# Patient Record
Sex: Female | Born: 1970 | Race: White | Hispanic: No | Marital: Married | Smoking: Current every day smoker
Health system: Southern US, Community
[De-identification: ages and names within clinical notes are randomized; demographics above are authoritative.]

## PROBLEM LIST (undated history)

## (undated) DIAGNOSIS — R630 Anorexia: Secondary | ICD-10-CM

## (undated) DIAGNOSIS — F329 Major depressive disorder, single episode, unspecified: Secondary | ICD-10-CM

## (undated) DIAGNOSIS — F1010 Alcohol abuse, uncomplicated: Secondary | ICD-10-CM

## (undated) DIAGNOSIS — F1920 Other psychoactive substance dependence, uncomplicated: Secondary | ICD-10-CM

## (undated) DIAGNOSIS — F319 Bipolar disorder, unspecified: Secondary | ICD-10-CM

## (undated) DIAGNOSIS — F419 Anxiety disorder, unspecified: Secondary | ICD-10-CM

## (undated) DIAGNOSIS — F32A Depression, unspecified: Secondary | ICD-10-CM

## (undated) DIAGNOSIS — M549 Dorsalgia, unspecified: Secondary | ICD-10-CM

## (undated) DIAGNOSIS — K589 Irritable bowel syndrome without diarrhea: Secondary | ICD-10-CM

---

## 2010-02-23 ENCOUNTER — Ambulatory Visit (HOSPITAL_COMMUNITY): Admission: RE | Admit: 2010-02-23 | Discharge: 2010-02-23 | Payer: Self-pay | Admitting: Obstetrics

## 2010-03-23 ENCOUNTER — Ambulatory Visit (HOSPITAL_COMMUNITY): Admission: RE | Admit: 2010-03-23 | Discharge: 2010-03-23 | Payer: Self-pay | Admitting: Obstetrics

## 2010-04-13 ENCOUNTER — Ambulatory Visit (HOSPITAL_COMMUNITY): Admission: RE | Admit: 2010-04-13 | Discharge: 2010-04-13 | Payer: Self-pay | Admitting: Obstetrics

## 2010-05-24 ENCOUNTER — Ambulatory Visit (HOSPITAL_COMMUNITY): Admission: RE | Admit: 2010-05-24 | Discharge: 2010-05-24 | Payer: Self-pay | Admitting: Obstetrics

## 2010-07-26 ENCOUNTER — Inpatient Hospital Stay (HOSPITAL_COMMUNITY): Admission: AD | Admit: 2010-07-26 | Discharge: 2010-07-27 | Payer: Self-pay | Admitting: Family Medicine

## 2010-08-07 ENCOUNTER — Inpatient Hospital Stay (HOSPITAL_COMMUNITY): Admission: AD | Admit: 2010-08-07 | Discharge: 2010-08-08 | Payer: Self-pay | Admitting: Obstetrics and Gynecology

## 2010-09-15 ENCOUNTER — Inpatient Hospital Stay (HOSPITAL_COMMUNITY): Admission: RE | Admit: 2010-09-15 | Discharge: 2010-09-18 | Payer: Self-pay | Admitting: Obstetrics & Gynecology

## 2010-10-17 ENCOUNTER — Emergency Department (HOSPITAL_COMMUNITY): Admission: EM | Admit: 2010-10-17 | Discharge: 2010-10-17 | Payer: Self-pay | Admitting: Emergency Medicine

## 2010-10-31 ENCOUNTER — Encounter
Admission: RE | Admit: 2010-10-31 | Discharge: 2010-12-01 | Payer: Self-pay | Source: Home / Self Care | Attending: Obstetrics and Gynecology | Admitting: Obstetrics and Gynecology

## 2010-12-15 ENCOUNTER — Emergency Department (HOSPITAL_COMMUNITY)
Admission: EM | Admit: 2010-12-15 | Discharge: 2010-12-15 | Payer: Self-pay | Source: Home / Self Care | Admitting: Emergency Medicine

## 2010-12-25 ENCOUNTER — Encounter: Payer: Self-pay | Admitting: Obstetrics

## 2010-12-29 ENCOUNTER — Emergency Department (HOSPITAL_COMMUNITY)
Admission: EM | Admit: 2010-12-29 | Discharge: 2010-12-29 | Payer: Self-pay | Source: Home / Self Care | Admitting: Emergency Medicine

## 2011-01-30 ENCOUNTER — Ambulatory Visit: Payer: Medicaid Other | Admitting: Rehabilitative and Restorative Service Providers"

## 2011-02-14 LAB — URINALYSIS, ROUTINE W REFLEX MICROSCOPIC
Bilirubin Urine: NEGATIVE
Ketones, ur: NEGATIVE mg/dL
Protein, ur: NEGATIVE mg/dL
Specific Gravity, Urine: 1.006 (ref 1.005–1.030)
Urobilinogen, UA: 0.2 mg/dL (ref 0.0–1.0)

## 2011-02-14 LAB — POCT I-STAT, CHEM 8
BUN: 8 mg/dL (ref 6–23)
Creatinine, Ser: 0.8 mg/dL (ref 0.4–1.2)
Glucose, Bld: 93 mg/dL (ref 70–99)
HCT: 44 % (ref 36.0–46.0)
Hemoglobin: 15 g/dL (ref 12.0–15.0)
Potassium: 3.8 mEq/L (ref 3.5–5.1)
Sodium: 140 mEq/L (ref 135–145)
TCO2: 29 mmol/L (ref 0–100)

## 2011-02-14 LAB — LITHIUM LEVEL: Lithium Lvl: 1.11 mEq/L (ref 0.80–1.40)

## 2011-02-14 LAB — CBC
MCH: 27.5 pg (ref 26.0–34.0)
MCHC: 32 g/dL (ref 30.0–36.0)
Platelets: 267 10*3/uL (ref 150–400)
RBC: 4.83 MIL/uL (ref 3.87–5.11)
RDW: 12.8 % (ref 11.5–15.5)
WBC: 9.8 10*3/uL (ref 4.0–10.5)

## 2011-02-14 LAB — DIFFERENTIAL
Basophils Absolute: 0 10*3/uL (ref 0.0–0.1)
Basophils Relative: 0 % (ref 0–1)

## 2011-02-15 LAB — CBC
HCT: 27.8 % — ABNORMAL LOW (ref 36.0–46.0)
Hemoglobin: 9.4 g/dL — ABNORMAL LOW (ref 12.0–15.0)
MCH: 29.6 pg (ref 26.0–34.0)
MCV: 87.7 fL (ref 78.0–100.0)
RBC: 3.17 MIL/uL — ABNORMAL LOW (ref 3.87–5.11)
RDW: 14.7 % (ref 11.5–15.5)
WBC: 25.9 10*3/uL — ABNORMAL HIGH (ref 4.0–10.5)

## 2011-02-16 LAB — URINALYSIS, ROUTINE W REFLEX MICROSCOPIC
Ketones, ur: 15 mg/dL — AB
Protein, ur: NEGATIVE mg/dL

## 2011-02-16 LAB — CBC
MCH: 29.1 pg (ref 26.0–34.0)
MCV: 86.5 fL (ref 78.0–100.0)
Platelets: 184 10*3/uL (ref 150–400)

## 2011-02-16 LAB — RPR: RPR Ser Ql: NONREACTIVE

## 2011-02-17 LAB — URINALYSIS, ROUTINE W REFLEX MICROSCOPIC
Glucose, UA: 100 mg/dL — AB
Hgb urine dipstick: NEGATIVE
Nitrite: NEGATIVE
Protein, ur: NEGATIVE mg/dL
Urobilinogen, UA: 0.2 mg/dL (ref 0.0–1.0)
pH: 6.5 (ref 5.0–8.0)

## 2011-02-17 LAB — WET PREP, GENITAL

## 2011-02-17 LAB — STREP B DNA PROBE: Strep Group B Ag: NEGATIVE

## 2011-02-17 LAB — GC/CHLAMYDIA PROBE AMP, GENITAL: GC Probe Amp, Genital: NEGATIVE

## 2011-04-07 ENCOUNTER — Ambulatory Visit: Payer: Medicaid Other | Attending: Internal Medicine | Admitting: Physical Therapy

## 2011-04-07 DIAGNOSIS — M545 Low back pain, unspecified: Secondary | ICD-10-CM | POA: Insufficient documentation

## 2011-04-07 DIAGNOSIS — M546 Pain in thoracic spine: Secondary | ICD-10-CM | POA: Insufficient documentation

## 2011-04-07 DIAGNOSIS — R293 Abnormal posture: Secondary | ICD-10-CM | POA: Insufficient documentation

## 2011-04-07 DIAGNOSIS — IMO0001 Reserved for inherently not codable concepts without codable children: Secondary | ICD-10-CM | POA: Insufficient documentation

## 2011-04-17 ENCOUNTER — Encounter: Payer: Medicaid Other | Admitting: Physical Therapy

## 2011-07-18 ENCOUNTER — Other Ambulatory Visit: Payer: Self-pay | Admitting: Gastroenterology

## 2011-07-20 ENCOUNTER — Other Ambulatory Visit: Payer: Medicaid Other

## 2011-07-28 ENCOUNTER — Ambulatory Visit
Admission: RE | Admit: 2011-07-28 | Discharge: 2011-07-28 | Disposition: A | Discharge status: Home / Self Care | Payer: Medicaid Other | Source: Ambulatory Visit | Attending: Gastroenterology | Admitting: Gastroenterology

## 2011-08-04 ENCOUNTER — Ambulatory Visit (HOSPITAL_BASED_OUTPATIENT_CLINIC_OR_DEPARTMENT_OTHER)
Admission: RE | Admit: 2011-08-04 | Discharge: 2011-08-04 | Disposition: A | Discharge status: Home / Self Care | Payer: Medicaid Other | Source: Ambulatory Visit | Attending: Urology | Admitting: Urology

## 2011-08-04 DIAGNOSIS — Z01812 Encounter for preprocedural laboratory examination: Secondary | ICD-10-CM | POA: Insufficient documentation

## 2011-08-04 DIAGNOSIS — N35919 Unspecified urethral stricture, male, unspecified site: Secondary | ICD-10-CM | POA: Insufficient documentation

## 2011-08-11 NOTE — Op Note (Signed)
  [name], Barbara Shaw           ACCOUNT NO.:  [id number]  MEDICAL RECORD NO.:  [id number]  LOCATION:                                 FACILITY:  PHYSICIAN:  Danae Chen, M.D.  DATE OF BIRTH:  07-15-1971  DATE OF PROCEDURE:  08/04/2011 DATE OF DISCHARGE:                              OPERATIVE REPORT   PREOPERATIVE DIAGNOSIS:  Meatal stenosis.  POSTOPERATIVE DIAGNOSIS:  Meatal stenosis.  PROCEDURE DONE:  Cystoscopy and meatal dilation.  SURGEON:  Danae Chen, M.D.  ANESTHESIA:  General.  INDICATIONS:  The patient is a 40 year old female who has been complaining of frequency, hesitancy, straining on urination for about 8 to 9 months.  Her symptoms are getting progressively worse.  She is scheduled today for cystoscopy and meatal dilation.  The patient was identified by her wristband, and proper time-out was taken.  Under general anesthesia, she was prepped and draped and placed in the dorsal lithotomy position.  A panendoscope was inserted in the bladder. The bladder mucosa is normal.  There is no stone or tumor in the bladder.  The ureteral orifices are in normal position and shape, with clear efflux.  There is no evidence of submucosal hemorrhage.  The bladder was examined with the foroblique and the right-angle lenses.  There was no evidence of trabeculation.  The cystoscope was then removed.  The urethra was then gradually dilated up to #32 [place].  Bimanual pelvic examination was then done.  There is no evidence of pelvic mass, and the cervix is firm, and there is no adnexal mass.  The patient tolerated the procedure well and left the OR in satisfactory condition to postanesthesia care unit.     Danae Chen, M.D.     MN/MEDQ  D:  08/04/2011  T:  08/04/2011  Job:  [id number]  Electronically Signed by Lindaann Slough M.D. on 08/11/2011 06:32:17 PM

## 2011-11-08 ENCOUNTER — Emergency Department (HOSPITAL_COMMUNITY)
Admission: EM | Admit: 2011-11-08 | Discharge: 2011-11-09 | Disposition: A | Discharge status: Home / Self Care | Payer: Medicaid Other | Attending: Emergency Medicine | Admitting: Emergency Medicine

## 2011-11-08 ENCOUNTER — Encounter: Payer: Self-pay | Admitting: Emergency Medicine

## 2011-11-08 ENCOUNTER — Emergency Department (HOSPITAL_COMMUNITY): Payer: Medicaid Other

## 2011-11-08 DIAGNOSIS — R61 Generalized hyperhidrosis: Secondary | ICD-10-CM | POA: Insufficient documentation

## 2011-11-08 DIAGNOSIS — R197 Diarrhea, unspecified: Secondary | ICD-10-CM | POA: Insufficient documentation

## 2011-11-08 DIAGNOSIS — M549 Dorsalgia, unspecified: Secondary | ICD-10-CM | POA: Insufficient documentation

## 2011-11-08 DIAGNOSIS — R1031 Right lower quadrant pain: Secondary | ICD-10-CM | POA: Insufficient documentation

## 2011-11-08 DIAGNOSIS — R112 Nausea with vomiting, unspecified: Secondary | ICD-10-CM

## 2011-11-08 DIAGNOSIS — F411 Generalized anxiety disorder: Secondary | ICD-10-CM | POA: Insufficient documentation

## 2011-11-08 DIAGNOSIS — R509 Fever, unspecified: Secondary | ICD-10-CM | POA: Insufficient documentation

## 2011-11-08 DIAGNOSIS — R630 Anorexia: Secondary | ICD-10-CM | POA: Insufficient documentation

## 2011-11-08 DIAGNOSIS — Z79899 Other long term (current) drug therapy: Secondary | ICD-10-CM | POA: Insufficient documentation

## 2011-11-08 DIAGNOSIS — K589 Irritable bowel syndrome without diarrhea: Secondary | ICD-10-CM | POA: Insufficient documentation

## 2011-11-08 DIAGNOSIS — R000 Tachycardia, unspecified: Secondary | ICD-10-CM | POA: Insufficient documentation

## 2011-11-08 DIAGNOSIS — F3189 Other bipolar disorder: Secondary | ICD-10-CM | POA: Insufficient documentation

## 2011-11-08 HISTORY — DX: Anxiety disorder, unspecified: F41.9

## 2011-11-08 HISTORY — DX: Irritable bowel syndrome, unspecified: K58.9

## 2011-11-08 HISTORY — DX: Anorexia: R63.0

## 2011-11-08 HISTORY — DX: Bipolar disorder, unspecified: F31.9

## 2011-11-08 LAB — COMPREHENSIVE METABOLIC PANEL
ALT: 15 U/L (ref 0–35)
AST: 18 U/L (ref 0–37)
Alkaline Phosphatase: 52 U/L (ref 39–117)
CO2: 27 mEq/L (ref 19–32)
GFR calc Af Amer: >90 mL/min (ref >90)
GFR calc non Af Amer: >90 mL/min (ref >90)
Glucose, Bld: 89 mg/dL (ref 70–99)
Potassium: 3.5 mEq/L (ref 3.5–5.1)
Sodium: 140 mEq/L (ref 135–145)
Total Protein: 6.8 g/dL (ref 6.0–8.3)

## 2011-11-08 LAB — CBC
Platelets: 367 10*3/uL (ref 150–400)
RBC: 4.68 MIL/uL (ref 3.87–5.11)
RDW: 13.4 % (ref 11.5–15.5)
WBC: 16 10*3/uL — ABNORMAL HIGH (ref 4.0–10.5)

## 2011-11-08 LAB — URINALYSIS, ROUTINE W REFLEX MICROSCOPIC
Bilirubin Urine: NEGATIVE
Glucose, UA: NEGATIVE mg/dL
Hgb urine dipstick: NEGATIVE
Specific Gravity, Urine: 1.031 — ABNORMAL HIGH (ref 1.005–1.030)
Urobilinogen, UA: 0.2 mg/dL (ref 0.0–1.0)

## 2011-11-08 LAB — DIFFERENTIAL
Basophils Absolute: 0 10*3/uL (ref 0.0–0.1)
Lymphocytes Relative: 5 % — ABNORMAL LOW (ref 12–46)
Lymphs Abs: 0.8 10*3/uL (ref 0.7–4.0)
Neutrophils Relative %: 89 % — ABNORMAL HIGH (ref 43–77)

## 2011-11-08 LAB — URINE MICROSCOPIC-ADD ON

## 2011-11-08 MED ORDER — OXYCODONE-ACETAMINOPHEN 5-325 MG PO TABS: 2.0000 | ORAL_TABLET | ORAL | Status: AC | PRN

## 2011-11-08 MED ORDER — MORPHINE SULFATE 4 MG/ML IJ SOLN
INTRAMUSCULAR | Status: AC
  Administered 2011-11-08: 4 mg
  Filled 2011-11-08: qty 1

## 2011-11-08 MED ORDER — ONDANSETRON HCL 4 MG/2ML IJ SOLN
INTRAMUSCULAR | Status: AC
  Filled 2011-11-08: qty 2

## 2011-11-08 MED ORDER — LORAZEPAM 2 MG/ML IJ SOLN
1.0000 mg | Freq: Once | INTRAMUSCULAR | Status: AC
  Administered 2011-11-08: 1 mg via INTRAVENOUS
  Filled 2011-11-08: qty 1

## 2011-11-08 MED ORDER — MORPHINE SULFATE 4 MG/ML IJ SOLN
4.0000 mg | Freq: Once | INTRAMUSCULAR | Status: AC
  Administered 2011-11-08: 4 mg via INTRAVENOUS
  Filled 2011-11-08: qty 1

## 2011-11-08 MED ORDER — MORPHINE SULFATE 4 MG/ML IJ SOLN: 4.0000 mg | Freq: Once | INTRAMUSCULAR | Status: DC

## 2011-11-08 MED ORDER — ONDANSETRON HCL 4 MG/2ML IJ SOLN
4.0000 mg | Freq: Once | INTRAMUSCULAR | Status: AC
  Administered 2011-11-08: 4 mg via INTRAMUSCULAR
  Filled 2011-11-08: qty 2

## 2011-11-08 MED ORDER — IOHEXOL 300 MG/ML  SOLN
80.0000 mL | Freq: Once | INTRAMUSCULAR | Status: AC | PRN
  Administered 2011-11-08: 80 mL via INTRAVENOUS

## 2011-11-08 MED ORDER — ONDANSETRON HCL 4 MG PO TABS: 4.0000 mg | ORAL_TABLET | Freq: Four times a day (QID) | ORAL | Status: AC

## 2011-11-08 MED ORDER — SODIUM CHLORIDE 0.9 % IV BOLUS (SEPSIS)
1000.0000 mL | Freq: Once | INTRAVENOUS | Status: AC
  Administered 2011-11-08: 1000 mL via INTRAVENOUS

## 2011-11-08 NOTE — ED Notes (Signed)
Patient is resting comfortably. 

## 2011-11-08 NOTE — ED Notes (Signed)
Pt in restroom providing urine sample.

## 2011-11-08 NOTE — ED Notes (Signed)
Patient is resting comfortably. Reports feeling " a lot less anxious".

## 2011-11-08 NOTE — ED Notes (Signed)
Pt continues to worry about medications. Pt label placed on pt medications. Pt threatning to take home meds.

## 2011-11-08 NOTE — ED Notes (Signed)
Pt in hallway bed. Pt states she is claustrophobic and doesn't want to be here. Pt appears anxious.

## 2011-11-08 NOTE — ED Provider Notes (Signed)
History     CSN: [id number] Arrival date & time: 11/08/2011  6:34 PM   First MD Initiated Contact with Patient 11/08/11 1927      Chief Complaint  Patient presents with  . Nausea    since yesterday. pt also  c/o diarrhea.    (Consider location/radiation/quality/duration/timing/severity/associated sxs/prior treatment) HPI Comments: Patient states she's been having right lower quadrant pain as well as vomiting and diarrhea since last night.  She states the vomiting was first to occur.  She has had 7 emesis episodes today and 3 episodes of diarrhea.  She denies hematemesis, hematochezia, blood in her stool.  Patient reports having fevers, night sweats, and chills.  She denies lightheadedness, syncope, and headaches.  In addition patient is suffering from major anxiety.  She states that she had a panic attack on the way here and is unable to keep down her Ativan because of her emesis episodes.  She complains of nausea and stomach cramping.  Patient has no other complaints.   The history is provided by the patient.    Past Medical History  Diagnosis Date  . IBS (irritable bowel syndrome)   . Anxiety   . Anorexia   . Bipolar affective disorder     History reviewed. No pertinent past surgical history.  History reviewed. No pertinent family history.  History  Substance Use Topics  . Smoking status: Not on file  . Smokeless tobacco: Not on file  . Alcohol Use:     OB History    Grav Para Term Preterm Abortions TAB SAB Ect Mult Living                  Review of Systems  Constitutional: Positive for appetite change. Negative for fever and chills.  HENT: Negative for sore throat, mouth sores, trouble swallowing, neck stiffness and dental problem.   Eyes: Negative for visual disturbance.  Respiratory: Negative for cough, chest tightness, shortness of breath and wheezing.   Cardiovascular: Negative for chest pain.  Gastrointestinal: Positive for nausea, vomiting, abdominal pain  and diarrhea. Negative for constipation, blood in stool, abdominal distention, anal bleeding and rectal pain.  Genitourinary: Negative for dysuria, urgency, hematuria and flank pain.  Musculoskeletal: Positive for back pain. Negative for myalgias, arthralgias and gait problem.  Skin: Negative for rash.  Neurological: Negative for dizziness, syncope, speech difficulty, numbness and headaches.  Hematological: Does not bruise/bleed easily.  Psychiatric/Behavioral: Negative for confusion.  All other systems reviewed and are negative.    Allergies  Septra  Home Medications   Current Outpatient Rx  Name Route Sig Dispense Refill  . ALPRAZOLAM 0.5 MG PO TABS Oral Take 0.5 mg by mouth 3 (three) times daily as needed. For anxiety      . BISMUTH SUBSALICYLATE 262 MG PO CHEW Oral Chew 524 mg by mouth as needed. For upset stomach     . BISMUTH SUBSALICYLATE 262 MG/15ML PO SUSP Oral Take 15 mLs by mouth every 6 (six) hours as needed. For upset stomach     . CHLORDIAZEPOXIDE HCL 5 MG PO CAPS Oral Take 5 mg by mouth 3 (three) times daily as needed. For anxiety     . CYCLOBENZAPRINE HCL 10 MG PO TABS Oral Take 10 mg by mouth 2 (two) times daily as needed. For muscle spasms     . HYDROCODONE-ACETAMINOPHEN 5-500 MG PO TABS Oral Take 1 tablet by mouth every 6 (six) hours as needed. For pain     . HYOSCYAMINE SULFATE 0.125 MG PO  TABS Oral Take 0.125 mg by mouth 3 (three) times daily as needed. For IBS     . LOPERAMIDE HCL 2 MG PO CAPS Oral Take 4 mg by mouth 4 (four) times daily as needed. For diarrhea      . NABUMETONE 500 MG PO TABS Oral Take 500 mg by mouth 2 (two) times daily.      Marland Kitchen OLANZAPINE-FLUOXETINE HCL 3-25 MG PO CAPS Oral Take 1 capsule by mouth every evening.      Marland Kitchen SILODOSIN 8 MG PO CAPS Oral Take 8 mg by mouth daily with breakfast.      . TRAZODONE HCL 50 MG PO TABS Oral Take 50 mg by mouth at bedtime.      Marland Kitchen FLUCONAZOLE 100 MG PO TABS Oral Take 100 mg by mouth daily.        BP 100/67   Pulse 96  Temp(Src) 99.6 F (37.6 C) (Oral)  Resp 16  SpO2 96%  Physical Exam  Nursing note and vitals reviewed. Constitutional: She is oriented to person, place, and time. She appears well-developed and well-nourished. No distress.  HENT:  Head: Normocephalic and atraumatic.  Eyes:       Normal appearance  Neck: Normal range of motion.  Cardiovascular: Regular rhythm.  Tachycardia present.   Pulmonary/Chest: Effort normal and breath sounds normal.  Abdominal: Soft. Bowel sounds are normal. She exhibits no distension and no mass. There is tenderness (right lower quadrant tenderness.  Tenderness is moderate.) in the right lower quadrant. There is no rebound and no guarding.       No CVA tenderness  Neurological: She is alert and oriented to person, place, and time.  Skin: Skin is warm and dry. No rash noted.  Psychiatric: Her speech is normal and behavior is normal. Her mood appears anxious.    ED Course  Procedures (including critical care time)  Labs Reviewed  CBC - Abnormal; Notable for the following:    WBC 16.0 (*)    All other components within normal limits  DIFFERENTIAL - Abnormal; Notable for the following:    Neutrophils Relative 89 (*)    Neutro Abs 14.2 (*)    Lymphocytes Relative 5 (*)    All other components within normal limits  URINALYSIS, ROUTINE W REFLEX MICROSCOPIC - Abnormal; Notable for the following:    APPearance CLOUDY (*)    Specific Gravity, Urine 1.031 (*)    pH 8.5 (*)    Ketones, ur 15 (*)    Protein, ur 30 (*)    Leukocytes, UA SMALL (*)    All other components within normal limits  URINE MICROSCOPIC-ADD ON - Abnormal; Notable for the following:    Squamous Epithelial / LPF MANY (*)    All other components within normal limits  COMPREHENSIVE METABOLIC PANEL  PREGNANCY, URINE   Ct Abdomen Pelvis W Contrast  11/08/2011  *RADIOLOGY REPORT*  Clinical Data: Mid to lower abdominal pain; right lower quadrant abdominal pain.  Diarrhea.  CT  ABDOMEN AND PELVIS WITH CONTRAST  Technique:  Multidetector CT imaging of the abdomen and pelvis was performed following the standard protocol during bolus administration of intravenous contrast.  Contrast: 80mL OMNIPAQUE IOHEXOL 300 MG/ML IV SOLN  Comparison: Abdominal ultrasound performed 07/28/2011  Findings: The visualized lung bases are clear.  A 1.2 cm hypodensity within the right hepatic lobe likely reflects a small cyst.  The liver is otherwise unremarkable in appearance. The gallbladder is within normal limits.  The spleen is normal in appearance.  The pancreas and adrenal glands are unremarkable.  The kidneys are unremarkable in appearance.  There is no evidence of hydronephrosis.  No renal or ureteral stones are seen.  No perinephric stranding is appreciated.  No free fluid is identified.  The small bowel is unremarkable in appearance.  The stomach is filled with contrast and is within normal limits.  No acute vascular abnormalities are seen.  The appendix is normal in caliber and contains minimal air, without evidence for appendicitis.  The cecum is noted at the right hemipelvis; the colon is partially filled with liquid stool. Scattered tiny foci of high attenuation are seen within the colon; these are also present on the lumbar spine radiographs performed last month.  These may reflect tiny pill fragments from the patient's medication.  The colon is otherwise unremarkable in appearance.  The bladder is decompressed and not well assessed.  The uterus is grossly unremarkable in appearance; an intrauterine device is noted in expected position, at the fundus of the uterus.  A 2.0 cm right adnexal cystic focus is likely physiologic in nature.  The ovaries are otherwise symmetric; no suspicious adnexal masses are seen.  No inguinal lymphadenopathy is seen.  No acute osseous abnormalities are identified.  A likely small bone infarct is noted within the right ilium, along the sacroiliac joint.  IMPRESSION:  1.   No acute abnormalities identified within the abdomen or pelvis. 2.  No evidence for appendicitis. 3.  Scattered tiny foci of high attenuation noted throughout the colon; these are also present on the lumbar spine radiographs from last month.  These may reflect tiny pill fragments from the patient's medication. 4.  Likely small hepatic cyst.  Original Report Authenticated By: Tonia Ghent, M.D.     No diagnosis found.  Patient's pain has been managed by a total of 8 IV morphine and she's been given 1 L bolus.  Patient currently is not having any pain.  She will be discharged with pain medication and Zofran ODT.  This plan has been discussed with Dr. Preston Fleeting who agrees with it.  Patient is currently hemodynamically stable, in no apparent distress, and has no current complaints.  Pt was educated on her CT findings and it was reccommended for to follow up with her PCP.   MDM  N/V/D Anxiety  dehydration        Jaci Carrel, [place] 11/08/11 2357

## 2011-11-09 NOTE — ED Provider Notes (Signed)
Evaluation and management procedures were performed by the PA/NP under my supervision/collaboration.   Francy Mcilvaine, MD 11/09/11 1037 

## 2011-11-09 NOTE — ED Notes (Signed)
Pt's prescription medications given back to her. Husband with pt.

## 2011-12-20 ENCOUNTER — Encounter (HOSPITAL_COMMUNITY): Payer: Self-pay | Admitting: *Deleted

## 2011-12-20 ENCOUNTER — Emergency Department (HOSPITAL_COMMUNITY)
Admission: EM | Admit: 2011-12-20 | Discharge: 2011-12-20 | Disposition: A | Discharge status: Home / Self Care | Payer: Medicaid Other | Source: Home / Self Care | Attending: Emergency Medicine | Admitting: Emergency Medicine

## 2011-12-20 DIAGNOSIS — S91012A Laceration without foreign body, left ankle, initial encounter: Secondary | ICD-10-CM

## 2011-12-20 DIAGNOSIS — S91009A Unspecified open wound, unspecified ankle, initial encounter: Secondary | ICD-10-CM

## 2011-12-20 HISTORY — DX: Dorsalgia, unspecified: M54.9

## 2011-12-20 MED ORDER — IBUPROFEN 600 MG PO TABS: 600.0000 mg | ORAL_TABLET | Freq: Four times a day (QID) | ORAL | Status: AC | PRN

## 2011-12-20 MED ORDER — LIDOCAINE-EPINEPHRINE 2 %-1:100000 IJ SOLN
5.0000 mL | Freq: Once | INTRAMUSCULAR | Status: AC
  Administered 2011-12-20: 5 mL

## 2011-12-20 MED ORDER — BACITRACIN 500 UNIT/GM EX OINT
1.0000 "application " | TOPICAL_OINTMENT | Freq: Once | CUTANEOUS | Status: AC
  Administered 2011-12-20: 1 via TOPICAL

## 2011-12-20 MED ORDER — HYDROCODONE-ACETAMINOPHEN 5-325 MG PO TABS: 2.0000 | ORAL_TABLET | ORAL | Status: AC | PRN

## 2011-12-20 NOTE — ED Notes (Signed)
Laceration left ankle onset approx 1:30 today - storm door closed on posterior ankle laceration approx 3 cm  - tetnus less then 5 years

## 2011-12-20 NOTE — ED Provider Notes (Signed)
History     CSN: [id number]  Arrival date & time 12/20/11  1421   First MD Initiated Contact with Patient 12/20/11 1434      No chief complaint on file.   (Consider location/radiation/quality/duration/timing/severity/associated sxs/prior treatment) HPI Comments: Patient states she was closing a glass door by pushing it shut with her left heel. States her foot slipped on the metal part of the door and she sustained a laceration to the posterior aspect of her ankle over the Achilles tendon several hours prior to arrival. no weakness, foreign body sensation, numbness, paresthesias, gross deformity. patient applied direct pressure with hemostasis and came here. He tetanus up-to-date.  ROS as noted in HPI. All other ROS negative.    Patient is a 41 y.o. female presenting with skin laceration. The history is provided by the patient.  Laceration  The incident occurred 3 to 5 hours ago. Pain location: posterior left ankle. The laceration is 3 cm in size. The laceration mechanism was a a metal edge. The pain has been constant since onset. Her tetanus status is UTD.    Past Medical History  Diagnosis Date  . IBS (irritable bowel syndrome)   . Anxiety   . Anorexia   . Bipolar affective disorder   . Back pain     History reviewed. No pertinent past surgical history.  History reviewed. No pertinent family history.  History  Substance Use Topics  . Smoking status: Current Everyday Smoker  . Smokeless tobacco: Not on file  . Alcohol Use: No    OB History    Grav Para Term Preterm Abortions TAB SAB Ect Mult Living                  Review of Systems  Constitutional: Negative for fever.  Skin: Positive for wound. Negative for color change.  Neurological: Negative for weakness and numbness.    Allergies  Septra  Home Medications   Current Outpatient Rx  Name Route Sig Dispense Refill  . ALPRAZOLAM 0.5 MG PO TABS Oral Take 0.5 mg by mouth 3 (three) times daily as needed.  For anxiety      . BISMUTH SUBSALICYLATE 262 MG PO CHEW Oral Chew 524 mg by mouth as needed. For upset stomach     . BISMUTH SUBSALICYLATE 262 MG/15ML PO SUSP Oral Take 15 mLs by mouth every 6 (six) hours as needed. For upset stomach     . CHLORDIAZEPOXIDE HCL 5 MG PO CAPS Oral Take 5 mg by mouth 3 (three) times daily as needed. For anxiety     . HYOSCYAMINE SULFATE 0.125 MG PO TABS Oral Take 0.125 mg by mouth 3 (three) times daily as needed. For IBS     . LOPERAMIDE HCL 2 MG PO CAPS Oral Take 4 mg by mouth 4 (four) times daily as needed. For diarrhea      . OLANZAPINE-FLUOXETINE HCL 3-25 MG PO CAPS Oral Take 1 capsule by mouth every evening.      Marland Kitchen SILODOSIN 8 MG PO CAPS Oral Take 8 mg by mouth daily with breakfast.      . TRAZODONE HCL 50 MG PO TABS Oral Take 50 mg by mouth at bedtime.      Marland Kitchen HYDROCODONE-ACETAMINOPHEN 5-325 MG PO TABS Oral Take 2 tablets by mouth every 4 (four) hours as needed for pain. 20 tablet 0  . IBUPROFEN 600 MG PO TABS Oral Take 1 tablet (600 mg total) by mouth every 6 (six) hours as needed for pain. 30  tablet 0    BP 103/69  Pulse 85  Temp(Src) 97.8 F (36.6 C) (Oral)  Resp 20  SpO2 97%  LMP 11/23/2011  Physical Exam  Nursing note and vitals reviewed. Constitutional: She is oriented to person, place, and time. She appears well-developed and well-nourished. No distress.  HENT:  Head: Normocephalic and atraumatic.  Eyes: Conjunctivae and EOM are normal.  Neck: Normal range of motion.  Cardiovascular: Normal rate.   Pulmonary/Chest: Effort normal.  Abdominal: She exhibits no distension.  Musculoskeletal: Normal range of motion.       Feet:       5/5 strength with plantarflexion and dorsiflexion bilaterally. Wound explored with adequate hemostasis through ROM, no apparent gross foreign body retained, no significant involvement of deep structures such as bone / joint / tendon / or neurovascular involvement noted.  Baseline Strength and Sensation to left foot  with normal light touch for Pt, distal NVI with CR< 2 secs and pulse intact.  Neurological: She is alert and oriented to person, place, and time.  Skin: Skin is warm and dry.  Psychiatric: She has a normal mood and affect. Her behavior is normal. Judgment and thought content normal.    ED Course  LACERATION REPAIR Date/Time: 12/20/2011 4:49 PM Performed by: Luiz Blare Authorized by: Luiz Blare Consent: Verbal consent obtained. Written consent not obtained. Risks and benefits: risks, benefits and alternatives were discussed Consent given by: patient Patient understanding: patient states understanding of the procedure being performed Patient consent: the patient's understanding of the procedure matches consent given Procedure consent: procedure consent matches procedure scheduled Relevant documents: relevant documents present and verified Test results: test results not available Site marked: the operative site was not marked Imaging studies: imaging studies not available Required items: required blood products, implants, devices, and special equipment available Patient identity confirmed: verbally with patient and arm band Time out: Immediately prior to procedure a "time out" was called to verify the correct patient, procedure, equipment, support staff and site/side marked as required. Body area: lower extremity Location details: left ankle Laceration length: 3 cm Foreign bodies: no foreign bodies Tendon involvement: none Nerve involvement: none Vascular damage: no Anesthesia: local infiltration Local anesthetic: lidocaine 2% with epinephrine Anesthetic total: 5 ml Patient sedated: no Preparation: Patient was prepped and draped in the usual sterile fashion. Irrigation solution: tap water (chlorhexadine soap) Irrigation method: syringe and tap Amount of cleaning: extensive Debridement: none Degree of undermining: none Skin closure: 3-0 nylon Number of sutures:  6 Technique: simple Approximation: close Approximation difficulty: simple Dressing: 4x4 sterile gauze, antibiotic ointment, pressure dressing and splint Patient tolerance: Patient tolerated the procedure well with no immediate complications.   (including critical care time)  Labs Reviewed - No data to display No results found.   1. Laceration of left ankle      MDM  Placing in aso to minimize foot movement. Discussed ice, elevation, nsaid, norco prn, minimizing activity over next week. Tetanus UTD. Pt to return next week for suture removal.  Luiz Blare, MD 12/20/11 2237

## 2011-12-21 ENCOUNTER — Telehealth (HOSPITAL_COMMUNITY): Payer: Self-pay | Admitting: *Deleted

## 2012-01-05 ENCOUNTER — Emergency Department (HOSPITAL_COMMUNITY)
Admission: EM | Admit: 2012-01-05 | Discharge: 2012-01-05 | Disposition: A | Discharge status: Other Acute Inpatient Hospital | Payer: Medicaid Other | Source: Home / Self Care | Attending: Emergency Medicine | Admitting: Emergency Medicine

## 2012-01-05 ENCOUNTER — Inpatient Hospital Stay (HOSPITAL_COMMUNITY)
Admission: AD | Admit: 2012-01-05 | Discharge: 2012-01-12 | DRG: 885 | Disposition: A | Discharge status: Home / Self Care | Payer: Medicaid Other | Source: Ambulatory Visit | Attending: Psychiatry | Admitting: Psychiatry

## 2012-01-05 ENCOUNTER — Encounter (HOSPITAL_COMMUNITY): Payer: Self-pay | Admitting: Emergency Medicine

## 2012-01-05 DIAGNOSIS — F3160 Bipolar disorder, current episode mixed, unspecified: Principal | ICD-10-CM | POA: Diagnosis present

## 2012-01-05 DIAGNOSIS — F1010 Alcohol abuse, uncomplicated: Secondary | ICD-10-CM

## 2012-01-05 DIAGNOSIS — Z79899 Other long term (current) drug therapy: Secondary | ICD-10-CM

## 2012-01-05 DIAGNOSIS — F3289 Other specified depressive episodes: Secondary | ICD-10-CM | POA: Insufficient documentation

## 2012-01-05 DIAGNOSIS — F329 Major depressive disorder, single episode, unspecified: Secondary | ICD-10-CM

## 2012-01-05 DIAGNOSIS — F411 Generalized anxiety disorder: Secondary | ICD-10-CM

## 2012-01-05 DIAGNOSIS — R45851 Suicidal ideations: Secondary | ICD-10-CM | POA: Insufficient documentation

## 2012-01-05 DIAGNOSIS — K589 Irritable bowel syndrome without diarrhea: Secondary | ICD-10-CM

## 2012-01-05 DIAGNOSIS — F603 Borderline personality disorder: Secondary | ICD-10-CM

## 2012-01-05 HISTORY — DX: Depression, unspecified: F32.A

## 2012-01-05 HISTORY — DX: Major depressive disorder, single episode, unspecified: F32.9

## 2012-01-05 HISTORY — DX: Alcohol abuse, uncomplicated: F10.10

## 2012-01-05 LAB — RAPID URINE DRUG SCREEN, HOSP PERFORMED
Barbiturates: NOT DETECTED
Cocaine: NOT DETECTED
Tetrahydrocannabinol: NOT DETECTED

## 2012-01-05 LAB — COMPREHENSIVE METABOLIC PANEL
ALT: 11 U/L (ref 0–35)
AST: 17 U/L (ref 0–37)
CO2: 24 mEq/L (ref 19–32)
Calcium: 9.5 mg/dL (ref 8.4–10.5)
Chloride: 101 mEq/L (ref 96–112)
Creatinine, Ser: 0.66 mg/dL (ref 0.50–1.10)
GFR calc Af Amer: >90 mL/min (ref >90)
GFR calc non Af Amer: >90 mL/min (ref >90)
Glucose, Bld: 85 mg/dL (ref 70–99)
Sodium: 136 mEq/L (ref 135–145)
Total Bilirubin: 0.1 mg/dL — ABNORMAL LOW (ref 0.3–1.2)

## 2012-01-05 LAB — CBC
HCT: 34.4 % — ABNORMAL LOW (ref 36.0–46.0)
MCHC: 34 g/dL (ref 30.0–36.0)
Platelets: 349 10*3/uL (ref 150–400)
RDW: 17.6 % — ABNORMAL HIGH (ref 11.5–15.5)

## 2012-01-05 MED ORDER — LORAZEPAM 1 MG PO TABS
1.0000 mg | ORAL_TABLET | Freq: Four times a day (QID) | ORAL | Status: DC | PRN
  Administered 2012-01-05: 1 mg via ORAL

## 2012-01-05 MED ORDER — LORAZEPAM 1 MG PO TABS
1.0000 mg | ORAL_TABLET | Freq: Once | ORAL | Status: AC
  Administered 2012-01-05: 1 mg via ORAL
  Filled 2012-01-05: qty 1

## 2012-01-05 MED ORDER — VITAMIN B-1 100 MG PO TABS
100.0000 mg | ORAL_TABLET | Freq: Every day | ORAL | Status: DC
  Administered 2012-01-05: 100 mg via ORAL
  Filled 2012-01-05: qty 1

## 2012-01-05 MED ORDER — MAGNESIUM HYDROXIDE 400 MG/5ML PO SUSP: 30.0000 mL | Freq: Every day | ORAL | Status: DC | PRN

## 2012-01-05 MED ORDER — IBUPROFEN 800 MG PO TABS
800.0000 mg | ORAL_TABLET | Freq: Four times a day (QID) | ORAL | Status: DC | PRN
  Administered 2012-01-05: 800 mg via ORAL
  Filled 2012-01-05: qty 1

## 2012-01-05 MED ORDER — CHLORDIAZEPOXIDE HCL 25 MG PO CAPS
25.0000 mg | ORAL_CAPSULE | Freq: Three times a day (TID) | ORAL | Status: DC | PRN
  Administered 2012-01-05 – 2012-01-12 (×18): 25 mg via ORAL
  Filled 2012-01-05 (×22): qty 1

## 2012-01-05 MED ORDER — NICOTINE 21 MG/24HR TD PT24: 21.0000 mg | MEDICATED_PATCH | Freq: Every day | TRANSDERMAL | Status: DC | PRN

## 2012-01-05 MED ORDER — OLANZAPINE 5 MG PO TABS: 5.0000 mg | ORAL_TABLET | Freq: Four times a day (QID) | ORAL | Status: DC | PRN

## 2012-01-05 MED ORDER — ALUM & MAG HYDROXIDE-SIMETH 200-200-20 MG/5ML PO SUSP: 30.0000 mL | ORAL | Status: DC | PRN

## 2012-01-05 MED ORDER — HYOSCYAMINE SULFATE 0.125 MG PO TABS
0.1250 mg | ORAL_TABLET | Freq: Four times a day (QID) | ORAL | Status: DC | PRN
  Filled 2012-01-05: qty 1

## 2012-01-05 MED ORDER — ONDANSETRON HCL 4 MG PO TABS: 4.0000 mg | ORAL_TABLET | Freq: Three times a day (TID) | ORAL | Status: DC | PRN

## 2012-01-05 MED ORDER — FOLIC ACID 1 MG PO TABS
1.0000 mg | ORAL_TABLET | Freq: Every day | ORAL | Status: DC
  Administered 2012-01-05: 1 mg via ORAL
  Filled 2012-01-05: qty 1

## 2012-01-05 MED ORDER — LORAZEPAM 2 MG/ML IJ SOLN: 1.0000 mg | Freq: Four times a day (QID) | INTRAMUSCULAR | Status: DC | PRN

## 2012-01-05 MED ORDER — ZOLPIDEM TARTRATE 5 MG PO TABS: 5.0000 mg | ORAL_TABLET | Freq: Every evening | ORAL | Status: DC | PRN

## 2012-01-05 MED ORDER — THIAMINE HCL 100 MG/ML IJ SOLN: 100.0000 mg | Freq: Every day | INTRAMUSCULAR | Status: DC

## 2012-01-05 MED ORDER — OLANZAPINE 5 MG PO TABS
5.0000 mg | ORAL_TABLET | Freq: Every day | ORAL | Status: DC
  Administered 2012-01-05: 5 mg via ORAL
  Filled 2012-01-05 (×2): qty 1

## 2012-01-05 MED ORDER — IBUPROFEN 200 MG PO TABS
400.0000 mg | ORAL_TABLET | Freq: Three times a day (TID) | ORAL | Status: DC | PRN
  Administered 2012-01-05: 400 mg via ORAL
  Filled 2012-01-05: qty 2

## 2012-01-05 MED ORDER — ACETAMINOPHEN 325 MG PO TABS: 650.0000 mg | ORAL_TABLET | Freq: Four times a day (QID) | ORAL | Status: DC | PRN

## 2012-01-05 MED ORDER — ADULT MULTIVITAMIN W/MINERALS CH
1.0000 | ORAL_TABLET | Freq: Every day | ORAL | Status: DC
  Administered 2012-01-05: 1 via ORAL
  Filled 2012-01-05: qty 1

## 2012-01-05 MED ORDER — TRAZODONE HCL 50 MG PO TABS
50.0000 mg | ORAL_TABLET | Freq: Every evening | ORAL | Status: DC | PRN
  Administered 2012-01-05 – 2012-01-06 (×2): 50 mg via ORAL
  Filled 2012-01-05 (×2): qty 1

## 2012-01-05 MED ORDER — LORAZEPAM 1 MG PO TABS
1.0000 mg | ORAL_TABLET | Freq: Three times a day (TID) | ORAL | Status: DC | PRN
  Filled 2012-01-05: qty 1

## 2012-01-05 MED ORDER — ACETAMINOPHEN 325 MG PO TABS: 650.0000 mg | ORAL_TABLET | ORAL | Status: DC | PRN

## 2012-01-05 MED ORDER — NABUMETONE 500 MG PO TABS
500.0000 mg | ORAL_TABLET | Freq: Two times a day (BID) | ORAL | Status: DC
  Administered 2012-01-05 – 2012-01-12 (×14): 500 mg via ORAL
  Filled 2012-01-05 (×18): qty 1

## 2012-01-05 MED ORDER — CYCLOBENZAPRINE HCL 10 MG PO TABS
10.0000 mg | ORAL_TABLET | Freq: Two times a day (BID) | ORAL | Status: DC | PRN
  Administered 2012-01-05 – 2012-01-11 (×13): 10 mg via ORAL
  Filled 2012-01-05 (×13): qty 1

## 2012-01-05 NOTE — ED Notes (Signed)
Pt alert, nad, c/o SI, pt plans to light charcoal after sealing up the windows, pt hx bi polar disorder, resp even unlabored, skin pwd

## 2012-01-05 NOTE — ED Notes (Signed)
Pt provided paper scrubs, provided instructions, family remains bedside

## 2012-01-05 NOTE — ED Notes (Signed)
Lab bedside to obtain samples 
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
## 2012-01-05 NOTE — ED Notes (Addendum)
Pt provided snack and oral hydration, cont to monitor

## 2012-01-05 NOTE — ED Notes (Signed)
MD at bedside. 

## 2012-01-05 NOTE — ED Notes (Signed)
Report called to [place] at [facility] requested that [facility] send pt. At 1530.
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
## 2012-01-06 DIAGNOSIS — F3160 Bipolar disorder, current episode mixed, unspecified: Secondary | ICD-10-CM | POA: Diagnosis present

## 2012-01-06 MED ORDER — RISPERIDONE 1 MG PO TABS
1.0000 mg | ORAL_TABLET | Freq: Every day | ORAL | Status: DC
  Administered 2012-01-06 – 2012-01-11 (×6): 1 mg via ORAL
  Filled 2012-01-06 (×3): qty 1
  Filled 2012-01-06: qty 7
  Filled 2012-01-06 (×6): qty 1

## 2012-01-06 MED ORDER — NICOTINE POLACRILEX 2 MG MT GUM
2.0000 mg | CHEWING_GUM | OROMUCOSAL | Status: DC | PRN
  Administered 2012-01-06 – 2012-01-12 (×29): 2 mg via ORAL
  Filled 2012-01-06 (×3): qty 1

## 2012-01-06 MED ORDER — CARBAMAZEPINE 200 MG PO TABS
200.0000 mg | ORAL_TABLET | Freq: Two times a day (BID) | ORAL | Status: DC
  Administered 2012-01-06 – 2012-01-12 (×12): 200 mg via ORAL
  Filled 2012-01-06 (×9): qty 1
  Filled 2012-01-06: qty 14
  Filled 2012-01-06 (×6): qty 1
  Filled 2012-01-06: qty 14
  Filled 2012-01-06: qty 1

## 2012-01-06 MED ORDER — CIPROFLOXACIN HCL 500 MG PO TABS: 500.0000 mg | ORAL_TABLET | Freq: Two times a day (BID) | ORAL | Status: DC

## 2012-01-06 NOTE — Progress Notes (Signed)
[facility] Group Notes:  (Counselor/Nursing/MHT/Case Management/Adjunct)  01/06/2012 1315  Type of Therapy:  Group Therapy  Participation Level:  Active  Participation Quality:  Appropriate and Attentive  Affect:  Appropriate  Cognitive:  Appropriate  Insight:  Good  Engagement in Group:  Good  Engagement in Therapy:  Good  Modes of Intervention:  Activity, Problem-solving and Socialization  Summary of Progress/Problems:  Pt. attended and participated in group session on self-sabotage. A Dialectical Behavior Therapy   Exercise was presented to the pt. and pt. was asked to identify how their emotional and/or rational behaviors affect how they sabotage themselves.   Pt states that she does not have the ability to think about things realistically. Pt states that she cannot trust her thoughts. Pt shared that she thinks about what others would do in situations first because she does not trust her own judgement. Pt stated that her mother does not understand her depression. Pt was talkative and sharing during group. Pt processed well and identified her sabotaging behaviors and is aware of what she needs to improve.   Barbara Shaw 01/06/2012, 2:52 PM

## 2012-01-06 NOTE — Progress Notes (Signed)
Pt. attended and participated in aftercare planning group. Suicide prevention information was given as well as suicide risk warning sign information and crisis line numbers to use.  Pt states that she has not seen a doctor since her admission 2 days ago. Pt does not think she needs to be here and would like to be D/C'd to see her Psychiatrist. Pt states that she has been taking medication for her Bipolar and recently relapsed after being sober for 2 years. Pt also states she has a 37 month old baby at home. Pt shared that prior to admission she had thoughts of suicide with a plan. Pt told her husband that when he goes to sleep she will take the car keys and kill herself. Pt's husband then admitted her to [facility]. Pt talked extremely fast during group and had manic-like symptoms.   [place], Desiree 01/06/2012, 9:36 AM

## 2012-01-06 NOTE — H&P (Signed)
Psychiatric Admission Assessment Adult  Patient Identification:  Barbara Shaw Date of Evaluation:  01/06/2012 Chief Complaint:  BIPOLAR DISORDER  ANXIETY  History of Present Illness:: Barbara Shaw is a 41 year old Caucasian female, admitted to [facility] from the [facility] ED with complaints of suicidal thoughts. Patient reports, "I have suicidal thoughts. I have been depressed x 7 days. I have bipolar disorder and been depressed all my life. I used to use alcohol to treat my illness. I drink, my anxiety is lower. I became an alcoholic. I was sober for a long time. But I relapsed this past holiday season. I have been drinking since then.  I need help with my medication. I don't think that my medication is working right now. I am thinking about suicide, but I did not attempt suicide. I alerted my husband before I could do anything stupid"  Mood Symptoms:  Depression, Depression Symptoms:  depressed mood, suicidal thoughts without plan, (Hypo) Manic Symptoms:  Irritable Mood, Anxiety Symptoms:  Excessive Worry, Psychotic Symptoms:  Hallucinations: None  PTSD Symptoms: None  Past Psychiatric History: Diagnosis: Bipolar disorder, depressed  Hospitalizations: [facility]  Outpatient Care: Dr Evelene Croon in [place]  Substance Abuse Care:  Self-Mutilation: None reported  Suicidal Attempts: denies  Violent Behaviors: denies   Past Medical History:   Past Medical History  Diagnosis Date  . IBS (irritable bowel syndrome)   . Anxiety   . Anorexia   . Bipolar affective disorder   . Back pain   . Depression   . Alcohol abuse    None. Allergies:   Allergies  Allergen Reactions  . Septra (Bactrim) Other (See Comments)    Makes her uncomfortable   PTA Medications: Prescriptions prior to admission  Medication Sig Dispense Refill  . ALPRAZolam (XANAX) 0.5 MG tablet Take 0.5 mg by mouth 3 (three) times daily as needed. For anxiety        . chlordiazePOXIDE (LIBRIUM) 5 MG capsule Take 5 mg  by mouth 3 (three) times daily as needed. For anxiety       . hyoscyamine (LEVSIN, ANASPAZ) 0.125 MG tablet Take 0.125 mg by mouth 3 (three) times daily as needed. For IBS       . loperamide (IMODIUM) 2 MG capsule Take 4 mg by mouth 4 (four) times daily as needed. For diarrhea        . nabumetone (RELAFEN) 500 MG tablet Take 500 mg by mouth 2 (two) times daily.      Marland Kitchen olanzapine-fluoxetine (SYMBYAX) 3-25 MG per capsule Take 1 capsule by mouth every evening.        . silodosin (RAPAFLO) 8 MG CAPS capsule Take 8 mg by mouth daily with breakfast.        . traZODone (DESYREL) 50 MG tablet Take 50 mg by mouth at bedtime.          Previous Psychotropic Medications:  Medication/Dose                 Substance Abuse History in the last 12 months: Substance Age of 1st Use Last Use Amount Specific Type  Nicotine 17  1/2 cigarettes  Alcohol 17   beer  Cannabis      Opiates      Cocaine "I used from 2001 to 2007"     Methamphetamines      LSD      Ecstasy      Benzodiazepines "I use Xanax prescribed"     Caffeine  Inhalants      Others:                         Consequences of Substance Abuse: Medical Consequences:  Liver damage Legal Consequences:  Arrests, jail time Family Consequences:  Family discord  Social History: Current Place of Residence:  Magazine features editor of Birth:  [place] Family Members: Husband and child Marital Status:  Married Children:1  Sons:1  Daughters:0 Relationships: Husband Education:  Corporate treasurer Problems/Performance: "I did well" Religious Beliefs/Practices: None reported History of Abuse (Emotional/Phsycial/Sexual): None reported Occupational Experiences: self employed Hotel manager History:  None. Legal History:None reported   Family History:  No family history on file.  Mental Status Examination/Evaluation: Objective:  Appearance: Casual and Neat  Eye Contact::  Good  Speech:  Clear and Coherent and Pressured  Volume:  Increased   Mood:  Depressed and Irritable  Affect:  Flat  Thought Process:  Coherent  Orientation:  Full  Thought Content:  Rumination  Suicidal Thoughts:  Yes.  without intent/plan  Homicidal Thoughts:  No  Memory:  Immediate;   Good Recent;   Good Remote;   Good  Judgement:  Impaired  Insight:  Fair  Psychomotor Activity:  Normal  Concentration:  Fair  Recall:  Good  Akathisia:  No  Handed:  Right  AIMS (if indicated):     Assets:  Desire for Improvement  Sleep:  Number of Hours: 6.75            Assessment:    AXIS I:  Bipolar, mixed AXIS II:  Borderline Personality Dis. AXIS III:   Past Medical History  Diagnosis Date  . IBS (irritable bowel syndrome)   . Anxiety   . Anorexia   . Bipolar affective disorder   . Back pain   . Depression   . Alcohol abuse    AXIS IV:  problems with access to health care services AXIS V:  41-50 serious symptoms  Treatment Plan/Recommendations: Admit for safety and stabilization.                                                                Discontinue Zyprexa,                                                                 Start Risperdal 1 mg Q hs.                                                                 Tegretol 200 mg bid.  Treatment Plan Summary: Daily contact with patient to assess and evaluate symptoms and progress in treatment Medication management Current Medications:  Current Facility-Administered Medications  Medication Dose Route Frequency Provider Last Rate Last Dose  . acetaminophen (TYLENOL) tablet 650 mg  650 mg Oral Q6H PRN Viviann Spare, NP      . alum & mag hydroxide-simeth (MAALOX/MYLANTA) 200-200-20 MG/5ML suspension 30 mL  30 mL Oral Q4H PRN Viviann Spare, NP      . carbamazepine (TEGRETOL) tablet 200 mg  200  mg Oral BID Sanjuana Kava, NP      . chlordiazePOXIDE (LIBRIUM) capsule 25 mg  25 mg Oral TID PRN Alyson Kuroski-Mazzei, DO   25 mg at 01/06/12 4098  . cyclobenzaprine (FLEXERIL) tablet 10 mg  10 mg Oral BID PRN Alyson Kuroski-Mazzei, DO   10 mg at 01/06/12 0831  . hyoscyamine (LEVSIN, ANASPAZ) tablet 0.125 mg  0.125 mg Oral Q6H PRN Viviann Spare, NP      . ibuprofen (ADVIL,MOTRIN) tablet 800 mg  800 mg Oral Q6H PRN Viviann Spare, NP   800 mg at 01/05/12 1902  . magnesium hydroxide (MILK OF MAGNESIA) suspension 30 mL  30 mL Oral Daily PRN Viviann Spare, NP      . nabumetone (RELAFEN) tablet 500 mg  500 mg Oral BID Viviann Spare, NP   500 mg at 01/06/12 1191  . nicotine polacrilex (NICORETTE) gum 2 mg  2 mg Oral PRN Orson Aloe, MD   2 mg at 01/06/12 1150  . risperiDONE (RISPERDAL) tablet 1 mg  1 mg Oral QHS Sanjuana Kava, NP      . traZODone (DESYREL) tablet 50 mg  50 mg Oral QHS PRN Viviann Spare, NP   50 mg at 01/05/12 2204  . DISCONTD: OLANZapine (ZYPREXA) tablet 5 mg  5 mg Oral QHS Viviann Spare, NP   5 mg at 01/05/12 2200  . DISCONTD: OLANZapine (ZYPREXA) tablet 5 mg  5 mg Oral Q6H PRN Viviann Spare, NP       Facility-Administered Medications Ordered in Other Encounters  Medication Dose Route Frequency Provider Last Rate Last Dose  . DISCONTD: acetaminophen (TYLENOL) tablet 650 mg  650 mg Oral Q4H PRN Laray Anger, DO      . DISCONTD: alum & mag hydroxide-simeth (MAALOX/MYLANTA) 200-200-20 MG/5ML suspension 30 mL  30 mL Oral PRN Laray Anger, DO      . DISCONTD: folic acid (FOLVITE) tablet 1 mg  1 mg Oral Daily Laray Anger, DO   1 mg at 01/05/12 1020  . DISCONTD: ibuprofen (ADVIL,MOTRIN) tablet 400 mg  400 mg Oral Q8H PRN Laray Anger, DO   400 mg at 01/05/12 1324  . DISCONTD: LORazepam (ATIVAN) injection 1 mg  1 mg Intravenous Q6H PRN Laray Anger, DO      . DISCONTD: LORazepam (ATIVAN) tablet 1 mg  1 mg Oral Q8H PRN Laray Anger,  DO      . DISCONTD: LORazepam (ATIVAN) tablet 1 mg  1 mg Oral Q6H PRN Laray Anger, DO   1 mg at 01/05/12 1321  . DISCONTD: mulitivitamin with minerals tablet 1 tablet  1 tablet Oral Daily Laray Anger, DO   1 tablet at 01/05/12 1020  . DISCONTD: nicotine (NICODERM CQ - dosed in mg/24 hours) patch 21 mg  21 mg Transdermal Daily PRN Laray Anger, DO      . DISCONTD: ondansetron [facility]) tablet 4  mg  4 mg Oral Q8H PRN Laray Anger, DO      . DISCONTD: thiamine (B-1) injection 100 mg  100 mg Intravenous Daily Laray Anger, DO      . DISCONTD: thiamine (VITAMIN B-1) tablet 100 mg  100 mg Oral Daily Laray Anger, DO   100 mg at 01/05/12 1020  . DISCONTD: zolpidem (AMBIEN) tablet 5 mg  5 mg Oral QHS PRN Laray Anger, DO        Observation Level/Precautions:  Q 15 minutes checks for safety  Laboratory:  Reviewed and noted ED lab findings on record  Psychotherapy:  Group  Medications:  See lists  Routine PRN Medications:  Yes  Consultations:  None indicated  Discharge Concerns:  Safety  Other:     Sanjuana Kava 2/2/201312:02 PM

## 2012-01-06 NOTE — Progress Notes (Signed)
Pt is a 41 yr old female that is blunted in affect and anxious in mood. Pt had previous nurse administer her a prn dose of librium and flexeril at 7pm. Pt wanted another prn dose of librium and flexeril at 10pm. Writer received vitals on this pt at 10pm, to discover the pt was hypotensive. Writer informed pt that librium can't be given at this time due to low BPs sitting and standing. Pt was upset to hear that we had to hold off on her prn librium. Pt was informed to try for librium at a later time. Pt was encouraged to drink fluids. Overall pt has been participating in groups and interacting on the unit at minimum levels. Comfort and support as needed was extended to this pt.
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
## 2012-01-06 NOTE — Progress Notes (Signed)
[facility] Adult Inpatient Family/Significant Other Suicide Prevention Education  Suicide Prevention Education:  Education Completed; Pt's husband Barbara Shaw [phone] has been identified by the patient as the family member/significant other with whom the patient will be residing, and identified as the person(s) who will aid the patient in the event of a mental health crisis (suicidal ideations/suicide attempt).  With written consent from the patient, the family member/significant other has been provided the following suicide prevention education, prior to the and/or following the discharge of the patient.  The suicide prevention education provided includes the following:  Suicide risk factors  Suicide prevention and interventions  National Suicide Hotline telephone number  [facility] assessment telephone number  [facility] Emergency Assistance 911  [facility] and/or Residential Mobile Crisis Unit telephone number  Request made of family/significant other to:  Remove weapons (e.g., guns, rifles, knives), all items previously/currently identified as safety concern.    Remove drugs/medications (over-the-counter, prescriptions, illicit drugs), all items previously/currently identified as a safety concern.  The family member/significant other verbalizes understanding of the suicide prevention education information provided.  The family member/significant other agrees to remove the items of safety concern listed above. Pt shared that they do have a gun in the home, husband stated the gun will be in a lock box and Barbara Shaw will not have access to gun or key. Barbara Shaw, LPCA   Barbara Shaw 01/06/2012, 10:52 AM

## 2012-01-06 NOTE — Progress Notes (Signed)
Pt has attended the groups and is interacting appropriately with her peers. Participates in all the groups and is showing insight into her issues. States today that she does not want to die, "it;s just so very hard for your children to see you in a manic state or when you are really depressed and cannot get out of bed in the mornings. Sad that she has bipolar disorder, and worried about her child. Given support and reassurance. Denies SI and HI.

## 2012-01-07 NOTE — Progress Notes (Signed)
[facility] MD Progress Note  01/07/2012 9:45 PM  "It has been a good day. I have been socializing a lot.   My mood is better. No suicidal thoughts today. My husband came in last night to visit with my baby. My baby did not run to me when he saw me. I am ok with it. This is a new place for him. I understand. The medicine tegretol, I like it. I think it will help when my body gets use to it. I am feeling a littlle drowsy, but I am fine"  Diagnosis:   Axis I: Bipolar, mixed, current episodes Axis II: Cluster B Traits Axis III:  Past Medical History  Diagnosis Date  . IBS (irritable bowel syndrome)   . Anxiety   . Anorexia   . Bipolar affective disorder   . Back pain   . Depression   . Alcohol abuse    Axis IV: No changes Axis V: 51-60 moderate symptoms  ADL's:  Intact  Sleep: Good  Appetite:  Fair  Suicidal Ideation:  Plan:  None Intent:  None Means:  None Homicidal Ideation:  Plan:  None Intent:  None Means:  None  AEB (as evidenced by):  Mental Status Examination/Evaluation: Objective:  Appearance: Casual  Eye Contact::  Good  Speech:  Pressured  Volume:  Increased  Mood:  Euthymic  Affect:  Appropriate  Thought Process:  Intact  Orientation:  Full  Thought Content:  concrete  Suicidal Thoughts:  No  Homicidal Thoughts:  No  Memory:  Immediate;   Good Recent;   Good Remote;   Good  Judgement:  Fair  Insight:  Fair  Psychomotor Activity:  Normal  Concentration:  Fair  Recall:  Good  Akathisia:  No  Handed:  Right  AIMS (if indicated):     Assets:  Desire for Improvement  Sleep:  Number of Hours: 6.75    Vital Signs:Blood pressure 72/47, pulse 99, temperature 97.8 F (36.6 C), temperature source Oral, resp. rate 16, height 5\' 5"  (1.651 m), weight 56.246 kg (124 lb), last menstrual period 11/23/2011. Current Medications: Current Facility-Administered Medications  Medication Dose Route Frequency Provider Last Rate Last Dose  . acetaminophen (TYLENOL)  tablet 650 mg  650 mg Oral Q6H PRN Viviann Spare, NP      . alum & mag hydroxide-simeth (MAALOX/MYLANTA) 200-200-20 MG/5ML suspension 30 mL  30 mL Oral Q4H PRN Viviann Spare, NP      . carbamazepine (TEGRETOL) tablet 200 mg  200 mg Oral BID Sanjuana Kava, NP   200 mg at 01/07/12 1710  . chlordiazePOXIDE (LIBRIUM) capsule 25 mg  25 mg Oral TID PRN Alyson Kuroski-Mazzei, DO   25 mg at 01/07/12 1712  . cyclobenzaprine (FLEXERIL) tablet 10 mg  10 mg Oral BID PRN Alyson Kuroski-Mazzei, DO   10 mg at 01/07/12 2046  . hyoscyamine (LEVSIN, ANASPAZ) tablet 0.125 mg  0.125 mg Oral Q6H PRN Viviann Spare, NP      . ibuprofen (ADVIL,MOTRIN) tablet 800 mg  800 mg Oral Q6H PRN Viviann Spare, NP   800 mg at 01/05/12 1902  . magnesium hydroxide (MILK OF MAGNESIA) suspension 30 mL  30 mL Oral Daily PRN Viviann Spare, NP      . nabumetone (RELAFEN) tablet 500 mg  500 mg Oral BID Viviann Spare, NP   500 mg at 01/07/12 1710  . nicotine polacrilex (NICORETTE) gum 2 mg  2 mg Oral PRN Orson Aloe, MD  2 mg at 01/07/12 1929  . risperiDONE (RISPERDAL) tablet 1 mg  1 mg Oral QHS Sanjuana Kava, NP   1 mg at 01/06/12 2245  . traZODone (DESYREL) tablet 50 mg  50 mg Oral QHS PRN Viviann Spare, NP   50 mg at 01/06/12 2245    Lab Results: No results found for this or any previous visit (from the past 48 hour(s)).  Physical Findings: AIMS:  , ,  ,  ,    CIWA:    COWS:     Treatment Plan Summary: Daily contact with patient to assess and evaluate symptoms and progress in treatment Medication management  Plan: Continue current treatment plan.  Armandina Stammer I 01/07/2012, 9:45 PM

## 2012-01-07 NOTE — Progress Notes (Signed)
Pt has been up in the milieu interacting with peers and staff. Speech is rapid and pressured. She is intrusive and exhibits racing thoughts with poor concentration. She still endorses some passive SI but contracts for safety. She is preoccupied with her meds and states several of them are not ordered appropriately. Encouraged pt to speak with team tomorrow. Gave flexeril, nicorette gum, librium and trazadone prn this evening at various times. On R/A at 2345 she is sleeping. Denies HI/AVH. Will continue to monitor closely. Lawrence Marseilles

## 2012-01-07 NOTE — Progress Notes (Signed)
Pt up and around unit, visible in milieu, interactions appropriate with staff and peers. Can be intrusive at times however is pleasant and respectful. Speech remains pressured and rapid at times. Given Flexeril to aid in back pain of 5/10. Pt signed 72 hr RFD form at 2030 and form is on chart with sticky note left in [facility] for MD. Supported, encouraged. Reviewed meds with pt as she can be vigilant about scheduling. When asked if she was having thoughts of suicide pt responded, "oh God no. I'm feeling good. Better than my other times I've been inpatient. I feel I've made real progress." Pt praised. She remains safe. No HI/AVH. Lawrence Marseilles

## 2012-01-07 NOTE — Progress Notes (Signed)
Pt has been attending the groups, participates and interacts appropriately. States that she has a wonderful support system from both her side of the family as well as her husbands side of the family. Reports a living relationship with her mother-in-law, yet the mother-in-law does not understand bipolar disorder and thinks that the Pt should just snap out of it. Pt's parents are educated on bipolar disorder and support her completely. Feels very lucky in this way. Talked about her illness and how she has to do positive things for herself. She states she is aware of her limitations. Given support, reassurance and praise.

## 2012-01-07 NOTE — Progress Notes (Signed)
[facility] Group Notes:  (Counselor/Nursing/MHT/Case Management/Adjunct)  01/07/2012 1315  Type of Therapy:  Group Therapy  Participation Level:  Active  Participation Quality:  Appropriate, Attentive, Redirectable and Sharing  Affect:  Appropriate  Cognitive:  Appropriate  Insight:  Good  Engagement in Group:  Good  Engagement in Therapy:  Good  Modes of Intervention:  Clarification, Problem-solving, Socialization and Support  Summary of Progress/Problems: Pt attended and participated counseling group on supports. Pt was asked to define what support means to them, identify a support and explore what to do when support is not present. Pt shared that she is diagnosed with Borderline Personality Disorder and states that she believes this affects her expectations of her supports negatively. Pt states that she expects her supports to be there at all times and understand her diagnosis. Pt admits to thinking that life is black or white and supports should be the same. Pt recalled a time when her mother told her that Depression is a choice. Pt does not feel that her mother is a healthy support at times, but feels that her husband is a healthy support. Pt processed well on supports and was able to identify what she expects from others who support her and ways to find alternative support in the community (i.e. Support groups, counseling, psychiatrists). Pt stated that she supports herself by taking a shower daily and doing her hair daily.    Makaila Windle 01/07/2012, 2:32 PM

## 2012-01-07 NOTE — Progress Notes (Signed)
Pt attended and participated in aftercare planning group and received suicide prevention information and crisis line phone numbers to use. Pt was talkative during group and stated that she is feeling great today. Pt states that she will live with her mother-in-law after D/C with her 55 month old baby.

## 2012-01-08 DIAGNOSIS — F3160 Bipolar disorder, current episode mixed, unspecified: Principal | ICD-10-CM

## 2012-01-08 LAB — CARBAMAZEPINE LEVEL, TOTAL: Carbamazepine Lvl: 8 ug/mL (ref 4.0–12.0)

## 2012-01-08 MED ORDER — TRAZODONE HCL 50 MG PO TABS
50.0000 mg | ORAL_TABLET | Freq: Every evening | ORAL | Status: DC | PRN
  Administered 2012-01-11: 50 mg via ORAL
  Filled 2012-01-08: qty 1

## 2012-01-08 MED ORDER — CLONIDINE HCL 0.1 MG PO TABS
0.1000 mg | ORAL_TABLET | Freq: Two times a day (BID) | ORAL | Status: DC
  Administered 2012-01-08 – 2012-01-11 (×5): 0.1 mg via ORAL
  Filled 2012-01-08 (×6): qty 1

## 2012-01-08 NOTE — Tx Team (Signed)
Interdisciplinary Treatment Plan Update (Adult)  Date:  01/08/2012  Time Reviewed:  11:16 AM   Progress in Treatment: Attending groups:   Yes   Participating in groups:  Yes Taking medication as prescribed:  Yes Tolerating medication:  Yes Family/Significant othe contact made: Counselor to make contact with family Patient understands diagnosis:  Yes Discussing patient identified problems/goals with staff: Yes Medical problems stabilized or resolved: Yes Denies suicidal/homicidal ideation:Yes Issues/concerns per patient self-inventory: None noted Other:  New problem(s) identified:  Reason for Continuation of Hospitalization: Anxiety Depression Medication stabilization Suicidal ideation Manie  Interventions implemented related to continuation of hospitalization:  Medication Management; safety checks q 15 mins  Additional comments:  Estimated length of stay: 2-4 days  Discharge Plan:  Home with family - f/u to be scheduled with Dr. Raeanne Barry goal(s):  Review of initial/current patient goals per problem list:    1.  Goal(s):  Eliminate SI  Met:  No  Target date: d/c  As evidenced by: Barbara Shaw is not endorsing SI at this time  2.  Goal (s): Reduce/Eliminate mania   Met:  No  Target date: d/c  As evidenced by: Symptoms of mania, pressure speech and racing thoughts  3.  Goal(s):  Stabilize on medications  Met:  No  Target date: d/c  As evidenced by: Will reports medications working - symptoms have decreases  4.  Goal(s):  Eliminate Psychosis  Met:  No  Target date: d/c  As evidenced by: Barbara Shaw  Attendees: Patient:     Family:     Physician:  Orson Aloe, MD 01/08/2012 11:16 AM   Nursing:    01/08/2012 11:16 AM   CaseManager:  Juline Patch, LCSW 01/08/2012 11:16 AM   Counselor:  Angus Palms, LCSW 01/08/2012 11:16 AM   Other:  01/08/2012 11:16 AM   Other:  Reyes Ivan, LCSWA  01/08/2012  11:16 AM   Other:     Other:      Scribe for Treatment Team:   Wynn Banker, LCSW,  01/08/2012 11:16 AM

## 2012-01-08 NOTE — Progress Notes (Signed)
Patient ID: Barbara Shaw, female   DOB: December 05, 1970, 41 y.o.   MRN: [id number] Patient's self inventory sheet, sleeps well, has good appetite, normal energy level, poor attetnion span.   Rated depression #8, hopelessness #2.  Has occasional cravings.   Denied SI.   Has back pain, plans to stretch to help back, worst pain #4.  After discharge, plans to keep in touch with friends.  Just wondering how she is going to tolerate her new meds.  Does have discharge plans.  No problems taking meds after discharge.   Patient will discuss her meds with doctor.

## 2012-01-08 NOTE — Progress Notes (Addendum)
Patient seen during discharge planning group.  She reports admitted to the hospital after having SI for several days.  She currently denies SI.  She endorses mania, depression rated at six/seven, anxiety at seven, and agoraphobia.  She advised of relapsing on ETOH over the holiday.  She reports being followed outpatient by Dr. Evelene Croon and Romeo Apple at  [facility] for therapy.  She reports plans to stay with mother-in-law at discharge for a few days.  Suicide prevention education reviewed during group.

## 2012-01-08 NOTE — Progress Notes (Signed)
Patient ID: Barbara Shaw, female   DOB: [birth year], 41 y.o.   MRN: [id number] Patient came up to window two times this evening sobbing. Did not discuss about what exactly was upsetting patient this evening. Patient had visitor this evening.

## 2012-01-08 NOTE — Progress Notes (Signed)
[facility] Group Notes: (Counselor/Nursing/MHT/Case Management/Adjunct) 01/08/2012   @1 :15pm  Type of Therapy:  Group Therapy  Participation Level:  Active  Participation Quality:  Attentive, Appropriate, Sharing  Affect:  Anxious, Irritable  Cognitive:  Appropriate  Insight:  Good  Engagement in Group: Good  Engagement in Therapy:  Good  Modes of Intervention:  Support and Exploration  Summary of Progress/Problems: Barbara Shaw participated in color breathing technique and safe place guided imagery technique. She stated that she has never had a good experience with meditation until today. [name] shared that this meditation helped her to calm herself and become more rational, as she was struggling with a distressing phone call before group, and was able to use the color breathing to rid her body of the stress it caused.   [name] 01/08/2012 3:25 PM

## 2012-01-08 NOTE — Progress Notes (Signed)
[facility] Group Notes:  (Counselor/Nursing/MHT/Case Management/Adjunct)  01/08/2012 12:05 PM  Type of Therapy:  Group Therapy  Participation Level:  Active  Participation Quality:  Appropriate  Affect:  Appropriate  Cognitive:  Appropriate  Insight:  Good  Engagement in Group:  Good  Engagement in Therapy:  Good  Modes of Intervention:  Support  Summary of Progress/Problems: Barbara Shaw was an active participant in group. She reported that making baby food is a passion for her and can help her pull out of a depressed mood. She also reported that signs her mental health is going downhill include self-isolating and neglecting self-and-home care (but not care of her child). Actively engaged in a discussion of skills to help her pull out of a depressed mood, including interacting with her infant son and finding ways to make him happy.   Mendleson, Jenna 01/08/2012, 12:05 PM

## 2012-01-08 NOTE — Progress Notes (Addendum)
Patient ID: Barbara Shaw, female   DOB: 28-Sep-1971, 41 y.o.   MRN: [id number] Pt has very pressured speech and has to be interrupted to get any questions answered.  She has never been on clonidine for verbal hyperactivity.  Will try clonidine for there verbal hyperactivity that may not be totally related to her mood disorder.  Her blood pressure is relatively low, but I am suggesting that she add a lot of salt to her diet to keep her blood pressure up.  It was 95/63 yesterday morning standing and 99/65 this AM standing. She describes having lots of tics and anxiety.  The clonidine could help the tics. She reports trouble sleeping that has improved with the addition of Risperdal. She has depression that occurs 3 days out of every month, so will get a Vit D level. She is also revealing her codependency. I suggested that she work her [employer] [facility] [employer] with a sponsor.

## 2012-01-09 MED ORDER — RISPERIDONE 0.25 MG PO TABS
0.1250 mg | ORAL_TABLET | Freq: Three times a day (TID) | ORAL | Status: DC
  Administered 2012-01-09 – 2012-01-10 (×3): 0.125 mg via ORAL
  Administered 2012-01-10: 0.25 mg via ORAL
  Administered 2012-01-10 – 2012-01-12 (×6): 0.125 mg via ORAL
  Filled 2012-01-09 (×2): qty 0.5
  Filled 2012-01-09 (×2): qty 7
  Filled 2012-01-09 (×2): qty 0.5
  Filled 2012-01-09: qty 7
  Filled 2012-01-09 (×4): qty 0.5
  Filled 2012-01-09: qty 1
  Filled 2012-01-09 (×4): qty 0.5

## 2012-01-09 MED ORDER — VITAMIN D (ERGOCALCIFEROL) 1.25 MG (50000 UNIT) PO CAPS
50000.0000 [IU] | ORAL_CAPSULE | ORAL | Status: DC
  Administered 2012-01-09: 50000 [IU] via ORAL
  Filled 2012-01-09: qty 1

## 2012-01-09 MED ORDER — CALCIUM CARBONATE 1250 (500 CA) MG PO TABS
500.0000 mg | ORAL_TABLET | Freq: Every day | ORAL | Status: DC
  Administered 2012-01-09 – 2012-01-12 (×4): 500 mg via ORAL
  Filled 2012-01-09 (×6): qty 1

## 2012-01-09 MED ORDER — HYOSCYAMINE SULFATE 0.125 MG SL SUBL
0.1250 mg | SUBLINGUAL_TABLET | Freq: Four times a day (QID) | SUBLINGUAL | Status: DC | PRN
  Administered 2012-01-09 – 2012-01-12 (×4): 0.125 mg via SUBLINGUAL
  Filled 2012-01-09: qty 1

## 2012-01-09 NOTE — Progress Notes (Signed)
Patient seen during d/c planning group.  She reports being very manic today and found out on yesterday that she has Tourette's syndrome.  She requested that she be allowed to have NA sponsor visit outside of visiting hours and that she be allowed to have calls from mother at her convenience.  She advised she fears drinking at discharge.
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
## 2012-01-09 NOTE — Progress Notes (Signed)
Patient ID: Barbara Shaw, female   DOB: 05-Nov-1971, 41 y.o.   MRN: [id number] Pt has had considerable push of speech.  She has to be interrupted to be able to ask any questions or to challenger her to make more effort to control her motor hyperactivity/push of speech. She describes the symptoms of Tourette Syndrome in that she has waxing and waning of various motor tics.  I will have ot explore any histiry of verbal tics. Associated with Tourette Syndrome is OCD 80% of the time, ADHD 50% of the time, Bipolar Mood Disorder 30% of the time, Borderline Personality Disorder 29% of the time, and Oppositional Defiant Disorder 35% of the time.  Some of the staff consider that she has significant features of all the associated conditions.   Discussed with her at length features of TS and bipolar disorder which she needs to exert more control over.   Spent 15 minutes in teaching pt yoga breathing techniques to help her decrease her pressure to talk and intrude on conversations interactions of others.She seemed to be motivated to learn, but at the same times seemed conflicted whether she would be able to succeed in mastering any of the skills or the internal awareness of her need to engage in her techniques to help her present in a more socially acceptable way. Pt denied any SI/HI. Mood felt to be great, but this observer notes it to be too great still.  Maybe 15% less so, but STILL  T O O great. Mania rated by this observer to be 5 or 6 on scale of 1 is the best (all better) and 10 is the worse.  She intrudes on conversations and interactions and causes a great deal of conflict on the unit.  She is not responding to most individual feedback.  This could be mired in her Oppositionality, OCD, and Borderline Personality. In treatment team yesterday she spoke of two different categories of voices that converse in her head.  She had terms for them and one of the groups were "[facility] Advoc[facility]".  This voices in the head like  a psychosis is present in TS about 5 to 10% of the time. Pt asks for information about TS.  Will supply her with that.

## 2012-01-09 NOTE — Tx Team (Addendum)
Interdisciplinary Treatment Plan Update (Adult)  Date:  01/09/2012  Time Reviewed:  11:58 AM   Progress in Treatment: Attending groups:   Yes   Participating in groups:  Yes Taking medication as prescribed:  Yes Tolerating medication:  Yes Family/Significant othe contact made: Contact has been made with husband Patient understands diagnosis:  Yes Discussing patient identified problems/goals with staff: Yes Medical problems stabilized or resolved: Yes Denies suicidal/homicidal ideation:Yes Issues/concerns per patient self-inventory:  Other:  New problem(s) identified:  Reason for Continuation of Hospitalization: Anxiety Mania Medication stabilization  Interventions implemented related to continuation of hospitalization:  MD to start Risperidal  Medication Management; safety checks q 15 mins  Additional comments:  Estimated length of stay: 2--3 days  Discharge Plan:  New goal(s):  Review of initial/current patient goals per problem list:    1.  Goal(s):  Eliminate SI  Met:  Yes  Target date: d/c  As evidenced by:  Barbara Shaw currently denies  2.  Goal (s):  Reduce/eliminate mania  Met:  No  Target date:  d/c  As evidenced by:  Barbara Shaw will report mania is under control  3.  Goal(s):  Eliminate Psychosis (she currently reports having discusions with the advocates and the committee)  Met:  No  Target date:  d/c  As evidenced by: Barbara Shaw will no longer endorse AH or decreased frequency.  She reports knowing that these are not real people but a coping mechanism  4.  Goal(s):  Stabilize on medications  Met:  Yes  Target date: d/c  As evidenced by:  Barbara Shaw will report medications are working  Attendees: Patient:  Barbara Shaw 2/5/201312:06 PM  Family:     Physician:  Orson Aloe, MD 01/09/2012 11:58 AM   Nursing:   Kenney Houseman, RN 01/09/2012 11:58 AM   CaseManager:  Juline Patch, LCSW 01/09/2012 11:58 AM   Counselor:  Angus Palms, LCSW 01/09/2012  11:58 AM   Other:  Consuello Bossier, NP 01/09/2012 11:58 AM   Other:  Reyes Ivan, LCSWA 01/09/2012  11:58 AM   Other:     Other:      Scribe for Treatment Team:   Wynn Banker, LCSW,  01/09/2012 11:58 AM

## 2012-01-09 NOTE — Progress Notes (Signed)
Lying quietly in bed with eyes closed.  Q 15 minute safety checks are in progress to maintain safety.
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
## 2012-01-09 NOTE — Progress Notes (Signed)
Grief and Loss Group 01/09/12  Facilitated grief on loss group 10-11am. Group oscillated between open engagement and occasional quiet. Members shared experiences with loss, primarily in relationships and loss of sense of self-worth and identity. Members interacted positively and supportively. Counselor facilitated discussion of loss and functionality of grief reactions, attempted to provide alternative views and healthy coping.  Pt was called from group by staff early in group, returned for few minutes, then left again. Pt minimally participated in group, engaged at beginning about types of loss (e.g., loss of independence, loss of sense of self after dx), but not upon returning to group.  -Bradley McKibben MS, LPCA, NCC

## 2012-01-10 NOTE — Progress Notes (Signed)
Recreation Therapy Notes  01/10/2012         Time: 1415      Group Topic/Focus: The focus of this group is on enhancing patients' problem solving skills, which involves identifying the problem, brainstorming solutions and choosing and trying a solution.   Participation Level: Active  Participation Quality: Distracted  Affect: Labile  Cognitive: Alert   Additional Comments: Patient reports she cannot participate in the group activity because she has tourettes, which makes her "bossy." patient encouraged to try her best and participate. At times patient joined in and participated in the activity, at other times she was observed crouched down in a ball with her hands over her head.   Barbara Shaw 01/10/2012 3:14 PM

## 2012-01-10 NOTE — Progress Notes (Signed)
[facility] Group Notes:  (Counselor/Nursing/MHT/Case Management/Adjunct)  01/10/2012 3:55 PM  Type of Therapy:  1:15PM Group Therapy  Participation Level:  Active  Participation Quality:  Appropriate and Attentive  Affect:  Appropriate  Cognitive:  Alert and Appropriate  Insight:  Good  Engagement in Group:  Good  Engagement in Therapy:  Good  Modes of Intervention:  Education and Exploration  Summary of Progress/Problems: Patient reported understanding and being familiar with "Mindfulness". When asked to use "Mindful Thinking" about a statement that read "Everything is as it should be" the patient seem very insightful. She stated this particular statement makes her think of a Higher Being/Higher Power. She stated the Mindfulness activity is one that she used in the past with her AA group meetings. She reported the statement was "simple with complex implications".   Wilmon Arms 01/10/2012, 3:55 PM  Cosigned by: Angus Palms, LCSW

## 2012-01-10 NOTE — Progress Notes (Signed)
[facility] Group Notes:  (Counselor/Nursing/MHT/Case Management/Adjunct)  01/10/2012 3:25 PM  Type of Therapy:  Group Therapy  Participation Level:  Minimal  Participation Quality:  Appropriate and Attentive  Affect:  Appropriate  Cognitive:  Alert and Appropriate  Insight:  Limited  Engagement in Group:  Limited  Engagement in Therapy:  Limited  Modes of Intervention:  Education and Exploration  Summary of Progress/Problems: Patient attended group towards the end of group discussion; however, patient seemed engaged and receptive to the thoughts and ideas of her peers.   Wilmon Arms 01/10/2012, 3:25 PM  Cosigned by: Angus Palms, LCSW
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
## 2012-01-10 NOTE — Progress Notes (Signed)
Patient seen during discharge planning group.  She reports being mixed today with the mania and anxiety both rated at seven.  She denies SI/HI.  She denies AH today and states she is coming to terms with the diagnosis of Tourettes.
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
## 2012-01-10 NOTE — Progress Notes (Signed)
Patient ID: Barbara Shaw, female   DOB: [birth year], 41 y.o.   MRN: [id number] Pt still has pressured speech and has to be interrupted to get a word in edgewise.  She gets upset when reminded that her speedometer is broken.  She notes that her thoughts are somewhat speeded up.  She feels that she is doing better.  She adds that her pressured speech has been a life long problem.  She describes that her thoughts are best described by her being on the bridge in the [employer] space ship.  She is seated in the middle of the large bridge and there are 20+ doors that are before her.  She has noted as many as 12 to 15 are open at a time and she feels that she can adequately respond to all open doors all 15 open doors and keep the conversations with 15 different people at the same time.  She says that she can pay attention to everyone while she is talking to the 14 other people.  This could be a combination of ADHD and Bipolar Disorder.  After she is in the office for about 5 minutes, she adds that the when she was on cocaine she felt the most calm and focused, yet her moods were out of control still. She does have a history of vocal tics, in that she stuttered badly as a child and she now spits or makes a sound similar to spitting.  This gives her the necessary criteria for the diagnosis of Tourette Syndrome.  She has been allowing herself to tic more to see if that helps her anxiety be less.  She feels that it may have been helping until two new people joined the hall.   She took the very low dose Risperdal and noted that it was much easier for her to fall asleep when she came back to her room after she had left the group meetings where she felt her agitation was interfering with the others in the group.  She does was able to go without the Librium and other patients had noted that she was asking questions of others and gave others a little time to respond.  She was able to redirect herself when she was concerned  about offending another patient instead of her usual running off and crying and avoiding the situation. Her thoughts are divided between "The Committees" and "The Advocates".  She notes that the committees are less loud on the low dose Risperdal, but the advocates are still working big time.  She reports that she has been advocating for 2 other patients to get their meds on time. She is recanting her 72 hour notice. She is willing to try the low dose Risperdal for another day.  Several staff have commented that she wears them out by her hyperness and pressured speech.  She has noted that she woke up at 0430 this AM and stated that this is consistent with her other manic episodes.

## 2012-01-11 MED ORDER — CLONIDINE HCL 0.1 MG PO TABS
0.0500 mg | ORAL_TABLET | Freq: Two times a day (BID) | ORAL | Status: DC
  Administered 2012-01-11 – 2012-01-12 (×2): 0.05 mg via ORAL
  Filled 2012-01-11 (×2): qty 7
  Filled 2012-01-11 (×4): qty 0.5

## 2012-01-11 NOTE — Progress Notes (Signed)
[facility] Group Notes:  (Counselor/Nursing/MHT/Case Management/Adjunct)  01/11/2012 2:29 PM  Type of Therapy:  Group Therapy  Participation Level:  Active  Participation Quality:  Appropriate and Attentive  Affect:  Appropriate  Cognitive:  Alert and Appropriate  Insight:  Good  Engagement in Group:  Good  Engagement in Therapy:  Good  Modes of Intervention:  Education and Exploration  Summary of Progress/Problems: Patient reported having racing thoughts led her to having feelings of being overwhelmed. Patient also stated understanding the importance of setting boundaries in certain relationships. Patient reported her son was like a Barista for her. He helped to motivate and keep her grounded in the present moment.    Wilmon Arms 01/11/2012, 2:29 PM  Cosigned by: Angus Palms, LCSW

## 2012-01-11 NOTE — Tx Team (Signed)
Interdisciplinary Treatment Plan Update (Adult)  Date:  01/11/2012  Time Reviewed:  10:52 AM   Progress in Treatment: Attending groups: Yes Participating in groups:  Yes Taking medication as prescribed:  Yes Tolerating medication: Yes Family/Significant othe contact made:  Yes, contact made with: husband Patient understands diagnosis: Yes Discussing patient identified problems/goals with staff:  Yes Medical problems stabilized or resolved: Yes Denies suicidal/homicidal ideation: Yes Issues/concerns per patient self-inventory:  No  Other:  New problem(s) identified: None  Reason for Continuation of Hospitalization: Mania Medication stabilization  Interventions implemented related to continuation of hospitalization:  Medication stabilization, safety checks q 15 mins, group attendance  Additional comments:  Estimated length of stay: 1-2 days  Discharge Plan: Discharge home, follow up with Dr Evelene Croon  New goal(s):  Review of initial/current patient goals per problem list:   1. Goal(s): Eliminate SI  Met: Yes  Target date: by d/c  As evidenced by: Barbara Shaw has not endorsed suicidal thoughts for several days 2. Goal (s): Reduce/eliminate mania  Met: No  Target date: d/c  As evidenced by: Barbara Shaw will report mania is under control - today reports that the mania has decreased but she can still tell it is present (speech is not as pressured but still rapid and someone forcible) 3. Goal(s): Eliminate Psychosis (she currently reports having discusions with the advocates and the committee)  Met: No  Target date: d/c  As evidenced by: Barbara Shaw will no longer endorse AH or decreased frequency. She reports knowing that these are not real people but a coping mechanism 4. Goal(s): Stabilize on medications  Met: Yes  Target date: d/c  As evidenced by: Barbara Shaw reports medications are working with no intolerable side effects      Attendees: Patient:     Family:     Physician:       Nursing:   Quintella Reichert, Rn 01/11/2012 10:52 AM  Case Manager:  Juline Patch, LCSW 01/11/2012 10:52 AM  Counselor:  Angus Palms, LCSW 01/11/2012 10:52 AM  Other:  Reyes Ivan, LCSWA 01/11/2012 10:52 AM  Other:  Serena Colonel, NP 01/11/2012 10:52 AM  Other:  Wilmon Arms, counseling intern 01/11/2012 10:52 AM  Other:  Trinda Pascal, RN 01/11/2012 10:52 AM   Scribe for Treatment Team:   [name], 01/11/2012 10:52 AM

## 2012-01-11 NOTE — Progress Notes (Signed)
Patient ID: Barbara Shaw, female   DOB: December 25, 1970, 41 y.o.   MRN: [id number] Pt seen this afternoon   in consult room.  She appeared much calmer today.  She reported that she was struggling to stay awake on the clonidine.  She may have to work her way up to this dose.  Will try a half a tab of clonidine 0.1 mg or in other words 0.05 mg dose BID.  She had much less push pf speech.  She stated that she was trying to use some coping skills to stay calm.  She had been pretty unreasonable this AM with several staff. Gave her the name of the book Tourette Syndrome and Human Behavior by Imagene Sheller MD.  She was most pleased w that. She noted a lot of overuse of her fingers and that sometimes they ached.  i suggested that she use Move Free or Osteo bi Flex as something to help her joints do better.  That has been submitted to her discharge instructions. She is hoping to D/C tomorrow because she is anticipating going to [place] on Sat to meet with a friend to celebrate someone's birthday.  That seems pretty doable from how calm she appears this afternoon. Denies any SI/HI. Is most concerned that she will get depressed again.  Tried to help her see how her moods have swung up and down for most of her life and that the med's can not guarantee that they won't swing again. She asks that I meet w her husband before she discharges.  I agreed that I would do that.

## 2012-01-11 NOTE — Progress Notes (Signed)
[facility] Group Notes:  (Counselor/Nursing/MHT/Case Management/Adjunct)  01/11/2012 3:08 PM  Type of Therapy:  1:15PM Group Therapy  Participation Level:  Did Not Attend  Wilmon Arms 01/11/2012, 3:08 PM  Cosigned by: Angus Palms, LCSW

## 2012-01-11 NOTE — Progress Notes (Addendum)
Patient has been very active today.  Redirection has been given to patient from nurse and MHT.   Patient has talked on phone to husband today, became very upset, and called husband back to smooth things over with him.  Patient was sleeping and did not go to lunch.   Lunch was brought back to her, stated she did not have lunch, but was in fact lunch was given to patient by MHT, and stated she did not remember this.   Requested caffeine drink from dining room, was given lemonade and ginger ale for lunch, stated she did not want these drinks and refused to eat her lunch.  Discussed patient's behavior with N.P.  Requested to use phone several times to call husband and was opportunities to talk to husband.  Encouraged patient to think positive thoughts, relaxation exercises.  MHT took patient to blow dry her hair which was done 3 minutes after patient's request.  Patient easily upset and was redirected by staff throughout the day. Patient did go to dining room tonight for dinner.     On self inventory sheet, patient sleeps fair, good appetite, trying to lose 5 lbs, one gained.  Normal energy level, good attention span.   Rated depression #3, hopelessness #5.  Denied SI.   Has experienced back pain.  Worst pain #4.  Plans to eat better, shower daily, go to bed earlier.

## 2012-01-12 MED ORDER — CHLORDIAZEPOXIDE HCL 25 MG PO CAPS: 25.0000 mg | ORAL_CAPSULE | Freq: Three times a day (TID) | ORAL | Status: AC | PRN

## 2012-01-12 MED ORDER — TRAZODONE HCL 50 MG PO TABS: 50.0000 mg | ORAL_TABLET | Freq: Every day | ORAL | Status: DC

## 2012-01-12 MED ORDER — RISPERIDONE 1 MG PO TABS: 1.0000 mg | ORAL_TABLET | Freq: Every day | ORAL | Status: DC

## 2012-01-12 MED ORDER — RISPERIDONE 0.25 MG PO TABS: 0.1250 mg | ORAL_TABLET | Freq: Three times a day (TID) | ORAL | Status: DC

## 2012-01-12 MED ORDER — VITAMIN D (ERGOCALCIFEROL) 1.25 MG (50000 UNIT) PO CAPS: 50000.0000 [IU] | ORAL_CAPSULE | ORAL | Status: DC

## 2012-01-12 MED ORDER — NICOTINE POLACRILEX 2 MG MT GUM: 2.0000 mg | CHEWING_GUM | OROMUCOSAL | Status: AC | PRN

## 2012-01-12 MED ORDER — NABUMETONE 500 MG PO TABS: 500.0000 mg | ORAL_TABLET | Freq: Two times a day (BID) | ORAL | Status: DC

## 2012-01-12 MED ORDER — CARBAMAZEPINE 200 MG PO TABS: 200.0000 mg | ORAL_TABLET | Freq: Two times a day (BID) | ORAL | Status: DC

## 2012-01-12 MED ORDER — HYOSCYAMINE SULFATE 0.125 MG PO TABS: 0.1250 mg | ORAL_TABLET | Freq: Three times a day (TID) | ORAL | Status: DC | PRN

## 2012-01-12 MED ORDER — CALCIUM-VITAMIN D 250-125 MG-UNIT PO TABS: 1.0000 | ORAL_TABLET | Freq: Every day | ORAL | Status: DC

## 2012-01-12 MED ORDER — CYCLOBENZAPRINE HCL 10 MG PO TABS: 10.0000 mg | ORAL_TABLET | Freq: Two times a day (BID) | ORAL | Status: AC | PRN

## 2012-01-12 MED ORDER — CLONIDINE HCL 0.1 MG PO TABS: 0.0500 mg | ORAL_TABLET | Freq: Two times a day (BID) | ORAL | Status: DC

## 2012-01-12 NOTE — Progress Notes (Addendum)
Pt has been under much better contol with her anxiety this evening. Pt is starting to accept her new diagnoses of tourette's, despite hiding its symptoms from others since the third grade. Pt this evening reports feeling worried about needing to help a friend. Pt was educated on the need to considerably take care of self first before handling other people's problems. Pt was receptive to this advice. Continued support and availability was extended to this pt. Pt safety remains with q67min checks.

## 2012-01-12 NOTE — [facility] Suicide Risk Assessment (Signed)
Suicide Risk Assessment  Discharge Assessment     Demographic factors:  Assessment Details Time of Assessment: Admission Information Obtained From: Patient Current Mental Status:  Current Mental Status: Suicidal ideation indicated by patient;Self-harm thoughts;Self-harm behaviors Risk Reduction Factors:  Risk Reduction Factors: Living with another person, especially a relative;Sense of responsibility to family  CLINICAL FACTORS:   Severe Anxiety and/or Agitation Bipolar Disorder:   Bipolar II Obsessive-Compulsive Disorder Chronic Pain Previous Psychiatric Diagnoses and Treatments Medical Diagnoses and Treatments/Surgeries  COGNITIVE FEATURES THAT CONTRIBUTE TO RISK:  Thought constriction (tunnel vision)    SUICIDE RISK:   Minimal: No identifiable suicidal ideation.  Patients presenting with no risk factors but with morbid ruminations; may be classified as minimal risk based on the severity of the depressive symptoms  ADL's:  Intact  Sleep: Good  Appetite:  Good  Suicidal Ideation:  Denies adamantly any suicidal thoughts. Homicidal Ideation:  Denies adamantly any homicidal thoughts.  Mental Status Examination/Evaluation: Objective:  Appearance: Casual  Eye Contact::  Good  Speech:  Slight pressure persists, but much better than on admission  Volume:  Normal  Mood:  Euthymic  Affect:  Congruent  Thought Process:  Coherent  Orientation:  Full  Thought Content:  WDL  Suicidal Thoughts:  No  Homicidal Thoughts:  No  Memory:  Immediate;   Good  Judgement:  Good  Insight:  Good  Psychomotor Activity:  Normal  Concentration:  Good  Recall:  Good  Akathisia:  No  AIMS (if indicated):     Assets:  Communication Skills Desire for Improvement Financial Resources/Insurance Housing Intimacy Leisure Time Physical Health Resilience Social Support Talents/Skills Transportation Vocational/Educational  Sleep:  [id number]   Vital Signs: Blood pressure 107/69,  pulse 90, temperature 97 F (36.1 C), temperature source Oral, resp. rate 20, height 5\' 5"  (1.651 m), weight 56.246 kg (124 lb), last menstrual period 11/23/2011, SpO2 100.00%. Current Medications:     . calcium carbonate  500 mg of elemental calcium Oral Daily  . carbamazepine  200 mg Oral BID  . cloNIDine  0.05 mg Oral BID  . nabumetone  500 mg Oral BID  . risperiDONE  0.125 mg Oral TID AC  . risperiDONE  1 mg Oral QHS  . Vitamin D (Ergocalciferol)  50,000 Units Oral Q7 days   Labs No results found for this or any previous visit (from the past 48 hour(s)).  What pt has learned from hospital stay is that she has a mild version of Tourette Syndrome and that this time she didn't attempt suicide and she got the help that she needed and not to be ashamed.  Risk of self harm is elevated by her history and diagnoses, but she has a wonderful famliy to live for and a belief in a higher power to live for.  Risk of harm to others is minimal in that she has not been involved in fights or had any legal charges filed on her.  PLAN: Discharge home Continue Medication List  As of 01/12/2012  5:02 PM   STOP taking these medications         ALPRAZolam 0.5 MG tablet      loperamide 2 MG capsule      olanzapine-fluoxetine 3-25 MG per capsule      silodosin 8 MG Caps capsule         TAKE these medications         calcium-vitamin D 250-125 MG-UNIT per tablet   Commonly known as: OSCAL   Take 1  tablet by mouth daily. For Vitamin D replacement and mood control      carbamazepine 200 MG tablet   Commonly known as: TEGRETOL   Take 1 tablet (200 mg total) by mouth 2 (two) times daily. For mood control      chlordiazePOXIDE 25 MG capsule   Commonly known as: LIBRIUM   Take 1 capsule (25 mg total) by mouth 3 (three) times daily as needed for anxiety. For anxiety, should give serious consideration to switching to Thorazine.      cloNIDine 0.1 MG tablet   Commonly known as: CATAPRES   Take 0.5  tablets (0.05 mg total) by mouth 2 (two) times daily. For tics and verbal hyperactiviety      cyclobenzaprine 10 MG tablet   Commonly known as: FLEXERIL   Take 1 tablet (10 mg total) by mouth 2 (two) times daily as needed for muscle spasms.      hyoscyamine 0.125 MG tablet   Commonly known as: LEVSIN, ANASPAZ   Take 1 tablet (0.125 mg total) by mouth 3 (three) times daily as needed. For IBS      nabumetone 500 MG tablet   Commonly known as: RELAFEN   Take 1 tablet (500 mg total) by mouth 2 (two) times daily. For back pain      nicotine polacrilex 2 MG gum   Commonly known as: NICORETTE   Take 1 each (2 mg total) by mouth as needed for smoking cessation.      risperiDONE 0.25 MG tablet   Commonly known as: RISPERDAL   Take 0.5 tablets (0.125 mg total) by mouth 3 (three) times daily before meals. For tics of Tourette Syndrome      risperiDONE 1 MG tablet   Commonly known as: RISPERDAL   Take 1 tablet (1 mg total) by mouth at bedtime. For tics and mood control and insomnia      traZODone 50 MG tablet   Commonly known as: DESYREL   Take 1 tablet (50 mg total) by mouth at bedtime. For inosmnia      Vitamin D (Ergocalciferol) 50000 UNITS Caps   Commonly known as: DRISDOL   Take 1 capsule (50,000 Units total) by mouth every 7 (seven) days. For Vitamin D replacement and mood control. Every Tuesday           [place], [place] 01/12/2012, 5:01 PM

## 2012-01-12 NOTE — Tx Team (Addendum)
Interdisciplinary Treatment Plan Update (Adult)  Date:  01/12/2012  Time Reviewed:  10:33 AM   Progress in Treatment: Attending groups: Yes Participating in groups:  Yes Taking medication as prescribed:  Yes Tolerating medication: Yes Family/Significant othe contact made:  Yes, contact made with: husband, Hal Patient understands diagnosis: Yes Discussing patient identified problems/goals with staff:  Yes Medical problems stabilized or resolved: Yes Denies suicidal/homicidal ideation: Yes Issues/concerns per patient self-inventory:  No  Other:  New problem(s) identified: None  Reason for Continuation of Hospitalization: appropriate for discharge today   Interventions implemented related to continuation of hospitalization:  Medication stabilization, safety checks q 15 mins, group attendance  Additional comments:  Estimated length of stay: discharge today   Discharge Plan: Discharge home and follow up with Dr. Evelene Croon  & Romeo Apple at [facility]  New goal(s):  Review of initial/current patient goals per problem list:   1. Goal(s): Eliminate SI  Met: Yes  Target date: by d/c  As evidenced by: Barbara Shaw has not endorsed suicidal thoughts for several days   2. Goal (s): Reduce/eliminate mania  Met: Yes Target date: d/c  As evidenced by: Barbara Shaw will report mania is under control - reports today that mania is decreased to a tolerable level   3. Goal(s): Eliminate Psychosis (she currently reports having discusions with the advocates and the committee)  Met: Yes  Target date: d/c  As evidenced by: Barbara Shaw reports that she does not hear any voices   4. Goal(s): Stabilize on medications  Met: Yes  Target date: d/c  As evidenced by: Barbara Shaw reports medications are working with no intolerable side effects    Attendees: Patient:  Barbara Shaw 01/12/2012 10:33 AM  Family:      Physician:  Dr Orson Aloe, MD 01/12/2012 10:33 AM  Nursing:   Omelia Blackwater, RN  01/12/2012  10:33 AM  Case Manager:  Juline Patch, LCSW 01/12/2012 10:33 AM  Counselor:  Angus Palms, LCSW 01/12/2012 10:33 AM  Other:  Reyes Ivan, LCSWA 01/12/2012 10:33 AM  Other:  Wilmon Arms, counseling intern 01/12/2012 10:33 AM  Other:  Charlyne Mom, doctoral psych intern 01/12/2012 10:33 AM      Scribe for Treatment Team:   [name], 01/12/2012 10:33 AM

## 2012-01-12 NOTE — Progress Notes (Signed)
Recreation Therapy Notes  01/12/2012         Time: 1415      Group Topic/Focus: The focus of the group is on enhancing the patients' ability to cope with stressors by understanding what coping is, why it is important, the negative effects of stress and developing healthier coping skills.  Participation Level: Active  Participation Quality: Attentive  Affect: Appropriate  Cognitive: Oriented   Additional Comments: None.   Sasha Rueth 01/12/2012 3:01 PM 

## 2012-01-12 NOTE — [facility] Suicide Risk Assessment (Signed)
Discharged from hospital w/scripts, samples, insttructions, appointments, left w/husband to go home, denies Si or Hi

## 2012-01-12 NOTE — Progress Notes (Signed)
[facility] Case Management Discharge Plan:  Will you be returning to the same living situation after discharge: Yes,  Barbara Shaw will return home with family At discharge, do you have transportation home?:Yes,  Husband to transport her home Do you have the ability to pay for your medications:Yes,  Barbara Shaw has Medicaid  Interagency Information:     Release of information consent forms completed and in the chart;  Patient's signature needed at discharge.  Patient to Follow up at:  Follow-up Information    Follow up with [facility] - [facility].   Contact information:   [address], [place]  [address]  [phone]      Follow up with Dr. Evelene Croon on 01/25/2012. (Your appointment with Dr. Evelene Croon is scheduled for Thursday, January 25, 2012 at 2:30 p.m. )    Contact information:   [address] [place], [place]  [address]  [phone]           Patient denies SI/HI:   Barbara Shaw denies any thoughts of SI or self harm    Safety Planning and Suicide Prevention discussed:  Yes,  Reviewed during discharge planning group  Barrier to discharge identified: None identified  Summary and Recommendations:  Barbara Shaw will contact therapist [facility] as she reports he is currently out of town.   Wynn Banker 01/12/2012, 10:47 AM

## 2012-01-12 NOTE — Progress Notes (Signed)
[facility] Group Notes:  (Counselor/Nursing/MHT/Case Management/Adjunct)  01/12/2012 12:27 PM  Type of Therapy:  Group Therapy  Participation Level:  Active  Participation Quality:  Attentive  Affect:  Appropriate  Cognitive:  Appropriate  Insight:  Good  Engagement in Group:  Good  Engagement in Therapy:  Good  Modes of Intervention:  Support  Summary of Progress/Problems: Actively participated in group, sharing her own experiences and offering suggestions to others in attendance   Luanna Cole 01/12/2012, 12:27 PM

## 2012-01-12 NOTE — Progress Notes (Signed)
[facility] Group Notes:  (Counselor/Nursing/MHT/Case Management/Adjunct)  01/12/2012 3:21 PM  Type of Therapy:  1:15PM Mental [employer] Group  Participation Level:  Active  Participation Quality:  Appropriate and Attentive  Affect:  Appropriate  Cognitive:  Alert and Appropriate  Insight:  Good  Engagement in Group:  Good  Engagement in Therapy:  Good  Modes of Intervention:  Education  Summary of Progress/Problems: Patient appeared interested in the information discussed by the peer support representative regarding the [employer].   Barbara Shaw 01/12/2012, 3:21 PM  Cosigned by: Angus Palms, LCSW

## 2012-01-15 NOTE — Progress Notes (Signed)
Patient Discharge Instructions:  Admission Note Faxed,  01/15/2012 After Visit Summary Faxed,  01/15/2012 Faxed to the [facility] Care provider:  01/15/2012 Facesheet faxed 01/15/2012   Faxed to Dr. Evelene Croon @ [phone] And to [facility] @ [phone]  Wandra Scot, 01/15/2012, 6:11 PM

## 2012-01-17 NOTE — Discharge Summary (Signed)
Physician Discharge Summary Note  Patient:  Barbara Shaw is an 41 y.o., female MRN:  [id number] DOB:  09-07-1971 Patient phone:  [phone] (home)  Patient address:   [address] [place] [address],   Date of Admission:  01/05/2012 Date of Discharge: 01/12/2012  Reason for Admission: Barbara Shaw is a 41 year old Caucasian female, admitted to [facility] from the [facility] ED with complaints of suicidal thoughts. Patient reports, "I have suicidal thoughts. I have been depressed x 7 days. I have bipolar disorder and been depressed all my life. I used to use alcohol to treat my illness. I drink, my anxiety is lower. I became an alcoholic. I was sober for a long time. But I relapsed this past holiday season. I have been drinking since then. I need help with my medication. I don't think that my medication is working right now. I am thinking about suicide, but I did not attempt suicide. I alerted my husband before I could do anything stupid"  Discharge Diagnoses: Active Problems:  Bipolar disorder, current episode mixed   Axis Diagnosis:   AXIS I:  Bipolar, Manic, Substance Abuse and Tourette Syndrome AXIS II:  Borderline Personality Dis. AXIS III:   Past Medical History  Diagnosis Date  . IBS (irritable bowel syndrome)   . Anxiety   . Anorexia   . Bipolar affective disorder   . Back pain   . Depression   . Alcohol abuse    AXIS IV:  other psychosocial or environmental problems AXIS V:  51-60 moderate symptoms  Level of Care:  OP  Hospital Course:   Pt was admitted for stabilization.  She was noted to have significant mania with push of speech.  She was tried on Clonidine for verbal motor hyperactivity as she described life long problem with talking incessantly.  It was considered somewhat helpful at 0.1 mg dose, but it caused too much day time sedation to be kept on it and it was reduced to 0.05 mg dose and thought to be helpful at that dose.  She was adjusted on  Risperdal at night for mood dysregulation and tics of Tourette syndrome.   Her day time anxiety was problematic and she was given smaller doses of Risperdal during the day with some success.  Tegretol was used and thought to be helpful in managing her mood dysregulation. She did have great difficulty with minding her own business and was advocating in an aggressive way for other patients on the unit.  She would seek out nurses and demand that they give other patient's their medications at times when she felt that the patients needed the meds.  The nurses were within hospital policy time frames for dispensing the medications, but the patient ddi not seem to appreciate that there were other nursing duties that could detain them form meeting what she perceived as their need on her time frame.  She was noted to have several motor tics while in the unit, particularly involving her wrists and arms as well as a few vocal tics of 'dry' spitting.  Being given the diagnosis, she was able to reduce her anxiety by giving herself permission to tic in the ways she had needed for years and had kept in check to keep herself presentable. This Clinical research associate spoke with the patient and her husband about the diagnosis of Tourette Syndrome.  They seemed to appreciate and understand the information given and felt that it had been helpful for the future management  of her condition.  She was noted to have low Vitamin D and was placed on replacement for that.  She was discharged for follow up with Dr Jeraldine Loots and was agreeable to that recomendation.   Consults:  None  Significant Diagnostic Studies:  None  Discharge Vitals:   Blood pressure 107/69, pulse 90, temperature 97 F (36.1 C), temperature source Oral, resp. rate 20, height 5\' 5"  (1.651 m), weight 56.246 kg (124 lb), last menstrual period 11/23/2011, SpO2 100.00%.  Mental Status Exam: See Mental Status Examination and Suicide Risk Assessment completed by Attending Physician prior to  discharge.  Discharge destination:  Home  Is patient on multiple antipsychotic therapies at discharge:  No    Medication List  As of 01/17/2012 11:28 PM   STOP taking these medications         ALPRAZolam 0.5 MG tablet      loperamide 2 MG capsule      olanzapine-fluoxetine 3-25 MG per capsule      silodosin 8 MG Caps capsule         TAKE these medications      Indication    calcium-vitamin D 250-125 MG-UNIT per tablet   Commonly known as: OSCAL   Take 1 tablet by mouth daily. For Vitamin D replacement and mood control       carbamazepine 200 MG tablet   Commonly known as: TEGRETOL   Take 1 tablet (200 mg total) by mouth 2 (two) times daily. For mood control       chlordiazePOXIDE 25 MG capsule   Commonly known as: LIBRIUM   Take 1 capsule (25 mg total) by mouth 3 (three) times daily as needed for anxiety. For anxiety, should give serious consideration to switching to Thorazine.       cloNIDine 0.1 MG tablet   Commonly known as: CATAPRES   Take 0.5 tablets (0.05 mg total) by mouth 2 (two) times daily. For tics and verbal hyperactiviety       cyclobenzaprine 10 MG tablet   Commonly known as: FLEXERIL   Take 1 tablet (10 mg total) by mouth 2 (two) times daily as needed for muscle spasms.       hyoscyamine 0.125 MG tablet   Commonly known as: LEVSIN, ANASPAZ   Take 1 tablet (0.125 mg total) by mouth 3 (three) times daily as needed. For IBS       nabumetone 500 MG tablet   Commonly known as: RELAFEN   Take 1 tablet (500 mg total) by mouth 2 (two) times daily. For back pain       nicotine polacrilex 2 MG gum   Commonly known as: NICORETTE   Take 1 each (2 mg total) by mouth as needed for smoking cessation.       risperiDONE 0.25 MG tablet   Commonly known as: RISPERDAL   Take 0.5 tablets (0.125 mg total) by mouth 3 (three) times daily before meals. For tics of Tourette Syndrome       risperiDONE 1 MG tablet   Commonly known as: RISPERDAL   Take 1 tablet (1 mg  total) by mouth at bedtime. For tics and mood control and insomnia       traZODone 50 MG tablet   Commonly known as: DESYREL   Take 1 tablet (50 mg total) by mouth at bedtime. For inosmnia       Vitamin D (Ergocalciferol) 50000 UNITS Caps   Commonly known as: DRISDOL   Take 1 capsule (50,000 Units total)  by mouth every 7 (seven) days. For Vitamin D replacement and mood control. Every Tuesday            Follow-up Information    Follow up with Lanny Hurst - [facility].   Contact information:   [address], [place]  [address]  [phone]      Follow up with Dr. Evelene Croon on 01/25/2012. (Your appointment with Dr. Evelene Croon is scheduled for Thursday, January 25, 2012 at 2:30 p.m. )    Contact information:   [address] [place], [place]  [address]  [phone]           Follow-up recommendations:  Other:  Keep follow-up appointments and take medications  Comments:    SignedOrson Aloe 01/17/2012, 11:28 PM

## 2012-02-15 ENCOUNTER — Encounter (HOSPITAL_COMMUNITY): Payer: Self-pay | Admitting: Emergency Medicine

## 2012-02-15 ENCOUNTER — Inpatient Hospital Stay (HOSPITAL_COMMUNITY)
Admission: EM | Admit: 2012-02-15 | Discharge: 2012-02-17 | DRG: 918 | Disposition: A | Discharge status: Other Acute Inpatient Hospital | Payer: Medicaid Other | Attending: Internal Medicine | Admitting: Internal Medicine

## 2012-02-15 ENCOUNTER — Other Ambulatory Visit: Payer: Self-pay

## 2012-02-15 DIAGNOSIS — K589 Irritable bowel syndrome without diarrhea: Secondary | ICD-10-CM | POA: Diagnosis present

## 2012-02-15 DIAGNOSIS — Z781 Physical restraint status: Secondary | ICD-10-CM | POA: Diagnosis present

## 2012-02-15 DIAGNOSIS — R45851 Suicidal ideations: Secondary | ICD-10-CM

## 2012-02-15 DIAGNOSIS — F1411 Cocaine abuse, in remission: Secondary | ICD-10-CM | POA: Diagnosis present

## 2012-02-15 DIAGNOSIS — T46901A Poisoning by unspecified agents primarily affecting the cardiovascular system, accidental (unintentional), initial encounter: Secondary | ICD-10-CM

## 2012-02-15 DIAGNOSIS — T465X1A Poisoning by other antihypertensive drugs, accidental (unintentional), initial encounter: Secondary | ICD-10-CM | POA: Diagnosis present

## 2012-02-15 DIAGNOSIS — I959 Hypotension, unspecified: Secondary | ICD-10-CM | POA: Diagnosis present

## 2012-02-15 DIAGNOSIS — F1010 Alcohol abuse, uncomplicated: Secondary | ICD-10-CM

## 2012-02-15 DIAGNOSIS — Z79899 Other long term (current) drug therapy: Secondary | ICD-10-CM

## 2012-02-15 DIAGNOSIS — T50902A Poisoning by unspecified drugs, medicaments and biological substances, intentional self-harm, initial encounter: Secondary | ICD-10-CM

## 2012-02-15 DIAGNOSIS — F17200 Nicotine dependence, unspecified, uncomplicated: Secondary | ICD-10-CM | POA: Diagnosis present

## 2012-02-15 DIAGNOSIS — F10929 Alcohol use, unspecified with intoxication, unspecified: Secondary | ICD-10-CM

## 2012-02-15 DIAGNOSIS — Z23 Encounter for immunization: Secondary | ICD-10-CM

## 2012-02-15 DIAGNOSIS — T50901A Poisoning by unspecified drugs, medicaments and biological substances, accidental (unintentional), initial encounter: Secondary | ICD-10-CM | POA: Diagnosis present

## 2012-02-15 DIAGNOSIS — F32A Depression, unspecified: Secondary | ICD-10-CM

## 2012-02-15 DIAGNOSIS — T50992A Poisoning by other drugs, medicaments and biological substances, intentional self-harm, initial encounter: Secondary | ICD-10-CM | POA: Diagnosis present

## 2012-02-15 DIAGNOSIS — F411 Generalized anxiety disorder: Secondary | ICD-10-CM | POA: Diagnosis present

## 2012-02-15 DIAGNOSIS — F1211 Cannabis abuse, in remission: Secondary | ICD-10-CM | POA: Diagnosis present

## 2012-02-15 DIAGNOSIS — E876 Hypokalemia: Secondary | ICD-10-CM | POA: Diagnosis present

## 2012-02-15 DIAGNOSIS — F319 Bipolar disorder, unspecified: Secondary | ICD-10-CM | POA: Diagnosis present

## 2012-02-15 DIAGNOSIS — I498 Other specified cardiac arrhythmias: Secondary | ICD-10-CM | POA: Diagnosis present

## 2012-02-15 DIAGNOSIS — M549 Dorsalgia, unspecified: Secondary | ICD-10-CM | POA: Diagnosis present

## 2012-02-15 DIAGNOSIS — F3160 Bipolar disorder, current episode mixed, unspecified: Secondary | ICD-10-CM | POA: Diagnosis present

## 2012-02-15 DIAGNOSIS — R630 Anorexia: Secondary | ICD-10-CM | POA: Diagnosis present

## 2012-02-15 DIAGNOSIS — F329 Major depressive disorder, single episode, unspecified: Secondary | ICD-10-CM

## 2012-02-15 DIAGNOSIS — Y92009 Unspecified place in unspecified non-institutional (private) residence as the place of occurrence of the external cause: Secondary | ICD-10-CM

## 2012-02-15 LAB — DIFFERENTIAL
Basophils Absolute: 0 10*3/uL (ref 0.0–0.1)
Basophils Relative: 0 % (ref 0–1)
Eosinophils Absolute: 0.3 10*3/uL (ref 0.0–0.7)
Eosinophils Relative: 4 % (ref 0–5)
Lymphocytes Relative: 31 % (ref 12–46)
Monocytes Absolute: 0.4 10*3/uL (ref 0.1–1.0)

## 2012-02-15 LAB — COMPREHENSIVE METABOLIC PANEL
AST: 13 U/L (ref 0–37)
CO2: 25 mEq/L (ref 19–32)
Calcium: 8.6 mg/dL (ref 8.4–10.5)
Creatinine, Ser: 0.55 mg/dL (ref 0.50–1.10)
GFR calc non Af Amer: >90 mL/min (ref >90)

## 2012-02-15 LAB — CBC
HCT: 34.3 % — ABNORMAL LOW (ref 36.0–46.0)
MCH: 27.6 pg (ref 26.0–34.0)
MCHC: 33.5 g/dL (ref 30.0–36.0)
MCV: 82.5 fL (ref 78.0–100.0)
RDW: 15.9 % — ABNORMAL HIGH (ref 11.5–15.5)

## 2012-02-15 LAB — RAPID URINE DRUG SCREEN, HOSP PERFORMED
Barbiturates: NOT DETECTED
Benzodiazepines: POSITIVE — AB
Cocaine: NOT DETECTED

## 2012-02-15 LAB — POCT PREGNANCY, URINE: Preg Test, Ur: NEGATIVE

## 2012-02-15 LAB — GLUCOSE, CAPILLARY: Glucose-Capillary: 98 mg/dL (ref 70–99)

## 2012-02-15 MED ORDER — SODIUM CHLORIDE 0.9 % IV SOLN
INTRAVENOUS | Status: DC
  Administered 2012-02-16 (×2): via INTRAVENOUS

## 2012-02-15 MED ORDER — SODIUM CHLORIDE 0.9 % IV BOLUS (SEPSIS)
2000.0000 mL | Freq: Once | INTRAVENOUS | Status: AC
  Administered 2012-02-15: 1000 mL via INTRAVENOUS

## 2012-02-15 NOTE — ED Notes (Signed)
JWJ:XBJY<NW> Expected date:02/15/12<BR> Expected time: 8:50 PM<BR> Means of arrival:Ambulance<BR> Comments:<BR> EMS M50 GC -- Suicide Attempt (OD)

## 2012-02-15 NOTE — ED Provider Notes (Signed)
History     CSN: [id number]  Arrival date & time 02/15/12  2051   First MD Initiated Contact with Patient 02/15/12 2113      Chief Complaint  Patient presents with  . Suicide Attempt    (Consider location/radiation/quality/duration/timing/severity/associated sxs/prior treatment) HPI This 41 year old female has a history of anxiety depression bipolar disorder alcohol abuse hospitalized last month for suicidal ideation and then discharge stating she's been anxious and depressed ever since discharge. Today she became suicidal and was drinking alcohol and then overdosed in a suicide attempt with approximately between 25-30 and no more than 30 clonidine 0.1 mg tablets. She says she feels high and has generalized body pain but denies shortness of breath abdominal pain vomiting and there has been no altered mental status or seizures. She is oriented to person place and time. The ingestion occurred at about 8:30 this evening. The patient remains anxious depressed and suicidal. She denies any threats to harm others denies homicidal ideation denies hallucinations the Past Medical History  Diagnosis Date  . IBS (irritable bowel syndrome)   . Anxiety   . Anorexia   . Bipolar affective disorder   . Back pain   . Depression   . Alcohol abuse     History reviewed. No pertinent past surgical history.  History reviewed. No pertinent family history.  History  Substance Use Topics  . Smoking status: Current Everyday Smoker -- 1.5 packs/day for 20 years  . Smokeless tobacco: Never Used  . Alcohol Use: 1.8 oz/week    3 Glasses of wine per week    OB History    Grav Para Term Preterm Abortions TAB SAB Ect Mult Living                  Review of Systems  Constitutional: Negative for fever.       10 Systems reviewed and are negative for acute change except as noted in the HPI.  HENT: Negative for congestion.   Eyes: Negative for discharge and redness.  Respiratory: Negative for cough and  shortness of breath.   Cardiovascular: Negative for chest pain.  Gastrointestinal: Negative for vomiting and abdominal pain.  Musculoskeletal: Positive for myalgias, back pain and arthralgias.  Skin: Negative for rash.  Neurological: Negative for syncope, numbness and headaches.  Psychiatric/Behavioral: Positive for suicidal ideas and self-injury. Negative for hallucinations. The patient is nervous/anxious.        No behavior change.    Allergies  Septra  Home Medications   No current outpatient prescriptions on file.  BP 112/73  Pulse 89  Temp(Src) 98.2 F (36.8 C) (Oral)  Resp 17  Ht 5\' 5"  (1.651 m)  Wt 131 lb 2.8 oz (59.5 kg)  BMI 21.83 kg/m2  SpO2 99%  LMP 02/15/2012  Physical Exam  Nursing note and vitals reviewed. Constitutional: She is oriented to person, place, and time.       Awake, alert, nontoxic appearance with baseline speech for patient.  HENT:  Head: Atraumatic.  Mouth/Throat: No oropharyngeal exudate.  Eyes: EOM are normal. Pupils are equal, round, and reactive to light. Right eye exhibits no discharge. Left eye exhibits no discharge.  Neck: Neck supple.  Cardiovascular: Normal rate and regular rhythm.   No murmur heard. Pulmonary/Chest: Effort normal and breath sounds normal. No stridor. No respiratory distress. She has no wheezes. She has no rales. She exhibits no tenderness.  Abdominal: Soft. Bowel sounds are normal. She exhibits no mass. There is no tenderness. There is no rebound.  Musculoskeletal: She exhibits no edema and no tenderness.       Baseline ROM, moves extremities with no obvious new focal weakness.  Lymphadenopathy:    She has no cervical adenopathy.  Neurological: She is alert and oriented to person, place, and time.       Awake, alert, cooperative and aware of situation; motor strength bilaterally; sensation normal to light touch bilaterally; peripheral visual fields full to confrontation; no facial asymmetry; tongue midline; major  cranial nerves appear intact; no pronator drift, normal finger to nose bilaterally, baseline gait without new ataxia.  Skin: No rash noted.  Psychiatric:       The patient's anxious depressed and suicidal ideation    ED Course  Procedures (including critical care time) ECG: Sinus rhythm, ventricular rate 84, normal axis, RSR' pattern V1, otherwise normal, no comparison ECG available  Due to the patient's borderline hypotension with initial pulse rate decreasing to become borderline bradycardic and did not feel the patient would be cleared for psychiatric evaluation tonight and the patient will be admitted to the hospitalist service for further medical observation and clearance due to the risk of bradycardia, hypotension, and airway compromise with clonidine overdose.  Patient seen immediately before and after chemical restraint given (within one hour) due to agitation, combative behavior, fighting police, and trying to leave requiring involuntary commitment petition completion.  Pt relaxed after chemical restraint, did not require physical restraints.  CRITICAL CARE Performed by: Hurman Horn   Total critical care time:  Critical care time was exclusive of separately billable procedures and treating other patients.  Critical care was necessary to treat or prevent imminent or life-threatening deterioration.  Critical care was time spent personally by me on the following activities: development of treatment plan with patient and/or surrogate as well as nursing, discussions with consultants, evaluation of patient's response to treatment, examination of patient, obtaining history from patient or surrogate, ordering and performing treatments and interventions, ordering and review of laboratory studies, ordering and review of radiographic studies, pulse oximetry and re-evaluation of patient's condition. Labs Reviewed  CBC - Abnormal; Notable for the following:    Hemoglobin 11.5 (*)     HCT 34.3 (*)    RDW 15.9 (*)    All other components within normal limits  COMPREHENSIVE METABOLIC PANEL - Abnormal; Notable for the following:    Potassium 3.4 (*)    Glucose, Bld 100 (*)    BUN 4 (*)    Total Bilirubin 0.1 (*)    All other components within normal limits  URINE RAPID DRUG SCREEN (HOSP PERFORMED) - Abnormal; Notable for the following:    Benzodiazepines POSITIVE (*)    All other components within normal limits  ETHANOL - Abnormal; Notable for the following:    Alcohol, Ethyl (B) 218 (*)    All other components within normal limits  SALICYLATE LEVEL - Abnormal; Notable for the following:    Salicylate Lvl <2.0 (*)    All other components within normal limits  BASIC METABOLIC PANEL - Abnormal; Notable for the following:    Glucose, Bld 118 (*)    BUN 4 (*)    Calcium 7.7 (*)    All other components within normal limits  CBC - Abnormal; Notable for the following:    RBC 3.82 (*)    Hemoglobin 10.4 (*)    HCT 31.7 (*)    RDW 16.1 (*)    All other components within normal limits  DIFFERENTIAL  ACETAMINOPHEN LEVEL  GLUCOSE,  CAPILLARY  POCT PREGNANCY, URINE  MRSA PCR SCREENING  BASIC METABOLIC PANEL     1. Overdose of cardiovascular agent   2. Depression   3. Suicidal overdose   4. Alcohol abuse   5. Alcohol intoxication       MDM  The patient appears reasonably stabilized for admission considering the current resources, flow, and capabilities available in the ED at this time, and I doubt any other [facility] requiring further screening and/or treatment in the ED prior to admission.        Hurman Horn, MD 02/16/12 [phone]

## 2012-02-15 NOTE — ED Notes (Signed)
Patient reports that she took 15 Clonidine 0.1 mg and drank 2 mike harden's alcohol beverage

## 2012-02-15 NOTE — ED Notes (Signed)
pts husband states that she's been depressed and bipolar most of her life, today she was watching a concert on Netflix and started crying and texting an old boyfriend, her husband then saw the empty bottle of pills and called 911.

## 2012-02-16 ENCOUNTER — Encounter (HOSPITAL_COMMUNITY): Payer: Self-pay | Admitting: Family Medicine

## 2012-02-16 DIAGNOSIS — T465X1A Poisoning by other antihypertensive drugs, accidental (unintentional), initial encounter: Secondary | ICD-10-CM | POA: Diagnosis present

## 2012-02-16 DIAGNOSIS — F3160 Bipolar disorder, current episode mixed, unspecified: Secondary | ICD-10-CM

## 2012-02-16 DIAGNOSIS — F1010 Alcohol abuse, uncomplicated: Secondary | ICD-10-CM

## 2012-02-16 DIAGNOSIS — T50992A Poisoning by other drugs, medicaments and biological substances, intentional self-harm, initial encounter: Secondary | ICD-10-CM

## 2012-02-16 DIAGNOSIS — F329 Major depressive disorder, single episode, unspecified: Secondary | ICD-10-CM

## 2012-02-16 DIAGNOSIS — T50901A Poisoning by unspecified drugs, medicaments and biological substances, accidental (unintentional), initial encounter: Secondary | ICD-10-CM | POA: Diagnosis present

## 2012-02-16 DIAGNOSIS — R45851 Suicidal ideations: Secondary | ICD-10-CM

## 2012-02-16 LAB — BASIC METABOLIC PANEL
CO2: 22 mEq/L (ref 19–32)
Calcium: 7.7 mg/dL — ABNORMAL LOW (ref 8.4–10.5)
GFR calc Af Amer: >90 mL/min (ref >90)
GFR calc non Af Amer: >90 mL/min (ref >90)
Sodium: 139 mEq/L (ref 135–145)

## 2012-02-16 LAB — CBC
Platelets: 283 10*3/uL (ref 150–400)
RBC: 3.82 MIL/uL — ABNORMAL LOW (ref 3.87–5.11)
RDW: 16.1 % — ABNORMAL HIGH (ref 11.5–15.5)
WBC: 8 10*3/uL (ref 4.0–10.5)

## 2012-02-16 LAB — MRSA PCR SCREENING: MRSA by PCR: NEGATIVE

## 2012-02-16 MED ORDER — VITAMIN B-1 100 MG PO TABS
100.0000 mg | ORAL_TABLET | Freq: Every day | ORAL | Status: DC
  Administered 2012-02-16 – 2012-02-17 (×2): 100 mg via ORAL
  Filled 2012-02-16 (×3): qty 1

## 2012-02-16 MED ORDER — ENOXAPARIN SODIUM 40 MG/0.4ML ~~LOC~~ SOLN
40.0000 mg | SUBCUTANEOUS | Status: DC
  Administered 2012-02-16: 40 mg via SUBCUTANEOUS
  Filled 2012-02-16 (×3): qty 0.4

## 2012-02-16 MED ORDER — CARBAMAZEPINE 100 MG PO CHEW
200.0000 mg | CHEWABLE_TABLET | Freq: Every day | ORAL | Status: DC
  Administered 2012-02-16 – 2012-02-17 (×2): 200 mg via ORAL
  Filled 2012-02-16 (×3): qty 2

## 2012-02-16 MED ORDER — LORAZEPAM 2 MG/ML IJ SOLN
1.0000 mg | Freq: Four times a day (QID) | INTRAMUSCULAR | Status: DC | PRN
  Administered 2012-02-16 – 2012-02-17 (×7): 1 mg via INTRAVENOUS
  Filled 2012-02-16 (×7): qty 1

## 2012-02-16 MED ORDER — CARBAMAZEPINE 100 MG PO CHEW
100.0000 mg | CHEWABLE_TABLET | Freq: Every day | ORAL | Status: DC
  Administered 2012-02-16 – 2012-02-17 (×2): 100 mg via ORAL
  Filled 2012-02-16 (×3): qty 1

## 2012-02-16 MED ORDER — FOLIC ACID 1 MG PO TABS
1.0000 mg | ORAL_TABLET | Freq: Every day | ORAL | Status: DC
  Administered 2012-02-16 – 2012-02-17 (×2): 1 mg via ORAL
  Filled 2012-02-16 (×3): qty 1

## 2012-02-16 MED ORDER — INFLUENZA VIRUS VACC SPLIT PF IM SUSP
0.5000 mL | INTRAMUSCULAR | Status: DC
  Filled 2012-02-16: qty 0.5

## 2012-02-16 MED ORDER — PNEUMOCOCCAL VAC POLYVALENT 25 MCG/0.5ML IJ INJ
0.5000 mL | INJECTION | INTRAMUSCULAR | Status: DC
  Filled 2012-02-16: qty 0.5

## 2012-02-16 MED ORDER — SODIUM CHLORIDE 0.9 % IV BOLUS (SEPSIS)
1000.0000 mL | Freq: Once | INTRAVENOUS | Status: AC
  Administered 2012-02-16: 1000 mL via INTRAVENOUS

## 2012-02-16 MED ORDER — LORAZEPAM 2 MG/ML IJ SOLN
1.0000 mg | Freq: Once | INTRAMUSCULAR | Status: AC
  Administered 2012-02-16: 1 mg via INTRAVENOUS
  Filled 2012-02-16: qty 1

## 2012-02-16 MED ORDER — HALOPERIDOL LACTATE 5 MG/ML IJ SOLN
5.0000 mg | Freq: Once | INTRAMUSCULAR | Status: AC
  Administered 2012-02-16: 5 mg via INTRAVENOUS
  Filled 2012-02-16: qty 1

## 2012-02-16 MED ORDER — ACETAMINOPHEN 325 MG PO TABS
650.0000 mg | ORAL_TABLET | Freq: Four times a day (QID) | ORAL | Status: DC | PRN
  Administered 2012-02-16 – 2012-02-17 (×4): 650 mg via ORAL
  Filled 2012-02-16 (×4): qty 2

## 2012-02-16 MED ORDER — NICOTINE 21 MG/24HR TD PT24
21.0000 mg | MEDICATED_PATCH | Freq: Every day | TRANSDERMAL | Status: DC
  Administered 2012-02-16 – 2012-02-17 (×2): 21 mg via TRANSDERMAL
  Filled 2012-02-16 (×3): qty 1

## 2012-02-16 MED ORDER — CARBAMAZEPINE 100 MG PO CHEW: 100.0000 mg | CHEWABLE_TABLET | Freq: Two times a day (BID) | ORAL | Status: DC

## 2012-02-16 NOTE — ED Notes (Signed)
Pt is combative swing at staff. Pt. Is cursing and at staff and trying to pull out her IV and monitor cables.

## 2012-02-16 NOTE — ED Notes (Signed)
Patient resting quietly with eyes closed. Respirations equal and unlabored. Skin warm and Dry. Patient has one to one sitter. Vital signs within normal limit.

## 2012-02-16 NOTE — Consult Note (Signed)
Patient Identification:  Barbara Shaw Date of Evaluation:  02/16/2012   History of Present Illness: Patient is a 41 year old Caucasian married female who was admitted on the medical floor she took overdose of Klonopin. Patient took almost 30 tablets. Patient admitted she was very depressed and felt that her current psychiatric medication is not working. [facility] on Tegretol, Risperdal, clonidine and Librium. She admitted that for past few weeks she is not sleeping well and complained of racing thoughts and anger. She also admitted having attention with her husband. She's been married for one year however she believed the relationship is not going very well. She also admitted drinking and have 2 drinks before she took overdose. She told this is her third overdose in her life. She believed that the medication works. She denies any hallucination however endorsed severe mood swing anger and nervousness.   Past Psychiatric History: As mentioned patient has significant psychiatric history she has at least 3 previous suicidal attempt. She was [facility] due to suicidal thinking. Patient has history of mania denies any history of psychosis.   Past Medical History:     Past Medical History  Diagnosis Date  . IBS (irritable bowel syndrome)   . Anxiety   . Anorexia   . Bipolar affective disorder   . Back pain   . Depression   . Alcohol abuse       History reviewed. No pertinent past surgical history.  Allergies:  Allergies  Allergen Reactions  . Septra (Bactrim) Other (See Comments)    Makes her uncomfortable    Current Medications:  Prior to Admission medications   Medication Sig Start Date End Date Taking? Authorizing Provider  acetaminophen (TYLENOL) 500 MG tablet Take 1,000 mg by mouth every 6 (six) hours as needed. For pain.   Yes Historical Provider, MD  ALPRAZolam Prudy Feeler) 0.5 MG tablet Take  0.5 mg by mouth 3 (three) times daily as needed. For anxiety/panic disorder.   Yes Historical Provider, MD  calcium-vitamin D (OSCAL) 250-125 MG-UNIT per tablet Take 1 tablet by mouth daily. For Vitamin D replacement and mood control 01/12/12 01/11/13 Yes Mike Craze, MD  carbamazepine (TEGRETOL) 100 MG chewable tablet Chew 100-200 mg by mouth 2 (two) times daily. 1 in am, 2 in pm   Yes Historical Provider, MD  chlordiazePOXIDE (LIBRIUM) 25 MG capsule Take 25 mg by mouth 3 (three) times daily.   Yes Historical Provider, MD  cloNIDine (CATAPRES) 0.1 MG tablet Take 1.5 mg by mouth once.    Yes Historical Provider, MD  cyclobenzaprine (FLEXERIL) 10 MG tablet Take 10 mg by mouth 2 (two) times daily as needed. For muscle pain.   Yes Historical Provider, MD  hyoscyamine (LEVSIN, ANASPAZ) 0.125 MG tablet Take 1 tablet (0.125 mg total) by mouth 3 (three) times daily as needed. For IBS 01/12/12  Yes Mike Craze, MD  nabumetone (RELAFEN) 500 MG tablet Take 1 tablet (500 mg total) by mouth 2 (two) times daily. For back pain 01/12/12  Yes Mike Craze, MD  traZODone (DESYREL) 50 MG tablet Take 1 tablet (50 mg total) by mouth at bedtime. For inosmnia 01/12/12  Yes Mike Craze, MD    Social History:    reports that she has been smoking.  She has never used smokeless tobacco. She reports that she drinks about 1.8 ounces of alcohol per week. She reports that she does not use illicit drugs.  Family History:    History reviewed. No pertinent family history.  Mental Status Examination/Evaluation: Objective:  Appearance: Guarded  Psychomotor Activity:  Increased and Restlessness  Eye Contact::  Fair  Speech:  Pressured  Volume:  Increased  Mood:  Angry, Depressed and Irritable  Affect:  Depressed  Thought Process:  Relevant  Orientation:  Full  Thought Content:  Suicidal ideation  Suicidal Thoughts:  Yes.  with intent/plan  Homicidal Thoughts:  No  Judgement:  Impaired  Insight:  Lacking     DIAGNOSIS:   AXIS I   bipolar disorder   AXIS II  Deffered  AXIS III See medical notes.  AXIS IV problems related to social environment and problems with primary support group  AXIS V 1-10 persistent dangerousness to self and others present     Assessment/Plan: The patient continued to exhibit symptoms of severe depression. She remains emotional and labile. Patient is at risk to harm herself. Patient should be transferred to behavioral [facility] when she is medically cleared. Continue one-to-one sitter for suicidal watch.

## 2012-02-16 NOTE — Progress Notes (Signed)
CHART REVIEWED; B Justyce Baby RN, BSN, MHA 

## 2012-02-16 NOTE — Progress Notes (Signed)
Assessment this am: RN asked if she wanted to hurt herself now, pt stated no, but I'm sorry it didn't work. Much emotional support given.

## 2012-02-16 NOTE — H&P (Signed)
PCP:   Dorrene German, MD, MD   Chief Complaint:  Clonidine overdose  HPI: History obtained mostly from ER physician as patient is a somewhat unwilling historian.  This is a 41 year old female who was brought in for clonidine overdose. She was apparently watching television when the husband noted that patient had an empty bottle of clonidine. Per report patient took approximately 25-35 0.1 mg tablets. It is unclear at what time she took medications. She has not received charcoal in the ER. The patient is alert and oriented, mildly bradycardic and mildly hypotensive. She has a very dry mouth. She was admitted here in February for suicidal ideations, since discharge she's continued to feel suicidal. She is married. Patient states that she does drink alcohol usually to self medicate. She states she stopped this for many years but recently restarted in January. She states she does smoke, she has a history of cocaine use and marijuana use in college. She states she occasionally buys pain pills from the streets. However she would give no detail on her clonidine intake. Family is not present at bedside. Patient has a black and blue left eye, she states this was completely accidental.  Review of Systems:  The patient denies anorexia, fever, weight loss,, vision loss, decreased hearing, hoarseness, chest pain, syncope, dyspnea on exertion, peripheral edema, balance deficits, hemoptysis, abdominal pain, melena, hematochezia, severe indigestion/heartburn, hematuria, incontinence, genital sores, muscle weakness, suspicious skin lesions, transient blindness, difficulty walking, depression, unusual weight change, abnormal bleeding, enlarged lymph nodes, angioedema, and breast masses.  Past Medical History: Past Medical History  Diagnosis Date  . IBS (irritable bowel syndrome)   . Anxiety   . Anorexia   . Bipolar affective disorder   . Back pain   . Depression   . Alcohol abuse    No past surgical history  on file.  Medications: Prior to Admission medications   Medication Sig Start Date End Date Taking? Authorizing Provider  acetaminophen (TYLENOL) 500 MG tablet Take 1,000 mg by mouth every 6 (six) hours as needed. For pain.   Yes Historical Provider, MD  ALPRAZolam Prudy Feeler) 0.5 MG tablet Take 0.5 mg by mouth 3 (three) times daily as needed. For anxiety/panic disorder.   Yes Historical Provider, MD  calcium-vitamin D (OSCAL) 250-125 MG-UNIT per tablet Take 1 tablet by mouth daily. For Vitamin D replacement and mood control 01/12/12 01/11/13 Yes Mike Craze, MD  carbamazepine (TEGRETOL) 100 MG chewable tablet Chew 100-200 mg by mouth 2 (two) times daily. 1 in am, 2 in pm   Yes Historical Provider, MD  chlordiazePOXIDE (LIBRIUM) 25 MG capsule Take 25 mg by mouth 3 (three) times daily.   Yes Historical Provider, MD  cloNIDine (CATAPRES) 0.1 MG tablet Take 1.5 mg by mouth once.    Yes Historical Provider, MD  cyclobenzaprine (FLEXERIL) 10 MG tablet Take 10 mg by mouth 2 (two) times daily as needed. For muscle pain.   Yes Historical Provider, MD  hyoscyamine (LEVSIN, ANASPAZ) 0.125 MG tablet Take 1 tablet (0.125 mg total) by mouth 3 (three) times daily as needed. For IBS 01/12/12  Yes Mike Craze, MD  nabumetone (RELAFEN) 500 MG tablet Take 1 tablet (500 mg total) by mouth 2 (two) times daily. For back pain 01/12/12  Yes Mike Craze, MD  traZODone (DESYREL) 50 MG tablet Take 1 tablet (50 mg total) by mouth at bedtime. For inosmnia 01/12/12  Yes Mike Craze, MD    Allergies:   Allergies  Allergen Reactions  .  Septra (Bactrim) Other (See Comments)    Makes her uncomfortable    Social History:  reports that she has been smoking.  She does not have any smokeless tobacco history on file. She reports that she drinks about 1.8 ounces of alcohol per week. She reports that she does not use illicit drugs.  Family History: No family history on file.  Physical Exam: Filed Vitals:   02/15/12 2230  02/15/12 2245 02/15/12 2300 02/15/12 2322  BP: 93/63 93/66 97/68  88/57  Pulse: 65 63 63 72  Temp:      TempSrc:      Resp: 24 22 23    Weight:      SpO2: 97% 97% 98% 97%    General:  Alert and oriented times three, well developed and nourished, depressed-appearing female  Eyes: PERRLA, pink conjunctiva, no scleral icterus, black and blue left eye ENT: Very dry oral mucosa, neck supple, no thyromegaly Lungs: clear to ascultation, no wheeze, no crackles, no use of accessory muscles Cardiovascular: regular rate and rhythm, no regurgitation, no gallops, no murmurs. No carotid bruits, no JVD Abdomen: soft, positive BS, non-tender, non-distended, no organomegaly, not an acute abdomen GU: not examined Neuro: CN II - XII grossly intact, sensation intact Musculoskeletal: strength 5/5 all extremities, no clubbing, cyanosis or edema Skin: no rash, no subcutaneous crepitation, no decubitus    Labs on Admission:   [facility] 02/15/12 2146  NA 140  K 3.4*  CL 106  CO2 25  GLUCOSE 100*  BUN 4*  CREATININE 0.55  CALCIUM 8.6  MG --  PHOS --    Basename 02/15/12 2146  AST 13  ALT 11  ALKPHOS 44  BILITOT 0.1*  PROT 6.4  ALBUMIN 3.5   No results found for this basename: LIPASE:2,AMYLASE:2 in the last 72 hours  Basename 02/15/12 2146  WBC 7.5  NEUTROABS 4.4  HGB 11.5*  HCT 34.3*  MCV 82.5  PLT 350   No results found for this basename: CKTOTAL:3,CKMB:3,CKMBINDEX:3,TROPONINI:3 in the last 72 hours No components found with this basename: POCBNP:3 No results found for this basename: DDIMER:2 in the last 72 hours No results found for this basename: HGBA1C:2 in the last 72 hours No results found for this basename: CHOL:2,HDL:2,LDLCALC:2,TRIG:2,CHOLHDL:2,LDLDIRECT:2 in the last 72 hours No results found for this basename: TSH,T4TOTAL,FREET3,T3FREE,THYROIDAB in the last 72 hours No results found for this basename: VITAMINB12:2,FOLATE:2,FERRITIN:2,TIBC:2,IRON:2,RETICCTPCT:2 in the last  72 hours  Micro Results: No results found for this or any previous visit (from the past 240 hour(s)).   Radiological Exams on Admission: No results found.  Assessment/Plan Present on Admission:  .Overdose drug .Clonidine poisoning .Bipolar disorder, current episode mixed Suicide attempts  Admit to step down Continue one-to-one Cardiac monitoring IV fluid hydration With patient's bradycardia heart rates is normalized, consulted patient psych  Most of patient's oral medications have been discontinued  Full code DVT prophylaxis Team 5/Dr. Art Buff, Angelise Petrich 02/16/2012, 12:16 AM

## 2012-02-16 NOTE — Progress Notes (Signed)
Called to room. Psych MD left after visit pt became increasingly more irritable. States the gown and wires are causing her not to breathe. She has taken gown and monitor leads off. She has escalated to the point of thrashing in bed. She requested to call her husband to make him aware of her transfer to [facility] this weekend.  RN replaced leads with much emotional support. Allowed to use phone to call husband. Sitter at bedside. Returned to room to retrieve phone, pt calmer now without c/o.

## 2012-02-16 NOTE — Progress Notes (Signed)
Subjective: Patient awake, oriented, no chest pain, no dyspnea.  She relates she took clonidine because she is depress. She now relates that she took medication around 8 PM last night. She denies ingesting ant other medication. The black eye, she relates was an accident.   Objective: Filed Vitals:   02/16/12 0300 02/16/12 0400 02/16/12 0600 02/16/12 0800  BP: 108/79   111/65  Pulse: 55 55 60 61  Temp: 98.7 F (37.1 C)   98.7 F (37.1 C)  TempSrc:    Oral  Resp: 21 21 22 23   Height: 5\' 5"  (1.651 m)     Weight: 59.5 kg (131 lb 2.8 oz)     SpO2: 99% 99% 98% 98%   Weight change:   Intake/Output Summary (Last 24 hours) at 02/16/12 0931 Last data filed at 02/16/12 0800  Gross per 24 hour  Intake 1029.58 ml  Output    875 ml  Net 154.58 ml    General: Alert, awake, oriented x3, in no acute distress.  HEENT: No bruits, no goiter.  Heart: Regular rate and rhythm, without murmurs, rubs, gallops.  Lungs: CTA, bilateral air movement.  Abdomen: Soft, nontender, nondistended, positive bowel sounds.  Neuro: Grossly intact, nonfocal. Extremities; No edema.   Lab Results:  [facility] 02/16/12 0529 02/15/12 2146  NA 139 140  K 4.0 3.4*  CL 110 106  CO2 22 25  GLUCOSE 118* 100*  BUN 4* 4*  CREATININE 0.54 0.55  CALCIUM 7.7* 8.6  MG -- --  PHOS -- --    Basename 02/15/12 2146  AST 13  ALT 11  ALKPHOS 44  BILITOT 0.1*  PROT 6.4  ALBUMIN 3.5    Basename 02/16/12 0529 02/15/12 2146  WBC 8.0 7.5  NEUTROABS -- 4.4  HGB 10.4* 11.5*  HCT 31.7* 34.3*  MCV 83.0 82.5  PLT 283 350    Micro Results: Recent Results (from the past 240 hour(s))  MRSA PCR SCREENING     Status: Normal   Collection Time   02/16/12  3:17 AM      Component Value Range Status Comment   MRSA by PCR NEGATIVE  NEGATIVE  Final     Studies/Results: No results found.  Medications: I have reviewed the patient's current medications.   Patient Active Hospital Problem List:  Overdose drug  (02/16/2012)/Intentional Clonidine. Psych consulted. Continue with IV fluids.  Poisson controled was contacted.  Monitor Hr , BP. Continue to monitor in step down.   Bipolar disorder, current episode mixed (01/06/2012) Will ask Psych to evaluates Suicidal intent (02/16/2012)   Depression (02/16/2012)   Alcohol abuse (02/16/2012) I will start thiamione and folate.       LOS: 1 day   Ace Bergfeld M.D.  [facility] 02/16/2012, 9:31 AM

## 2012-02-16 NOTE — ED Notes (Signed)
Pt. Swing and kicking  at Patent examiner, hospital security and nursing staff while trying administer IM medication. Pt very combative and out of control.

## 2012-02-16 NOTE — Progress Notes (Signed)
Spoke with Pt re: current admission.  Pt presents as very anxious, with pressured speech and rapid thought processes.  Pt admits that she overdosed but denied that it was a suicide attempt.  She explains that she wasn't trying to kill herself, rather she just want to "fast-forward time" to her appt with her psychiatrist next week.  Pt reports that she's been dx with Bipolar, Agoraphobia, OCD and Tourette's.  Pt reports that she has been depressed since stopping her medications so that she could breastfeed.  She reports that she breastfed for 4 weeks and had trouble functioning without her Lithium, Risperidone, Librium and Xanax.  She reports that she stopped breastfeeding to return to her medication regimen and that she hasn't felt right since.  This has been for over 1 year.  Pt states that she is followed by Dr. Evelene Croon and that she has an appt next week.  She states that at her last appt, Dr. Evelene Croon mentioned that ECT may be an effective tx for Pt.  Pt is thinking about getting a second opinion before she proceeds with this option.  Pt sees a therapist, Lanny Hurst, on an outpt basis, with last visit 2 weeks ago.  Pt reports that she was at [facility] 1 month ago for 8 days due to SI.    Discussed psych MD's recommendation.  Pt reports that she'd rather d/c home than [facility].  She reports that she's extremely anxious and that [facility] would only heighten this.  Pt in need of medication and to use the restroom.  She asked if CSW would excuse her.  CSW to discuss Pt's concerns with psych MD.  Sherron Monday with Pt's MD.  Per Dr. Sunnie Nielsen, Pt may be ready for d/c tomorrow.  Spoke with psych MD re: Pt's wishes to go home.  Per psych MD, Pt needs to go to [facility].  D/c'ing home is not an appropriate plan, at this time.  Notified Pt, RN and MD.  CSW to notify weekend CSW to anticipate a d/c to [facility] tomorrow.  CSW to continue to follow.  Providence Crosby, LCSWA Clinical Social Work [phone]

## 2012-02-17 ENCOUNTER — Telehealth (HOSPITAL_COMMUNITY): Payer: Self-pay | Admitting: Licensed Clinical Social Worker

## 2012-02-17 ENCOUNTER — Inpatient Hospital Stay (HOSPITAL_COMMUNITY)
Admission: AD | Admit: 2012-02-17 | Discharge: 2012-02-19 | DRG: 885 | Disposition: A | Discharge status: Home / Self Care | Payer: Medicaid Other | Attending: Psychiatry | Admitting: Psychiatry

## 2012-02-17 DIAGNOSIS — K589 Irritable bowel syndrome without diarrhea: Secondary | ICD-10-CM

## 2012-02-17 DIAGNOSIS — T50992A Poisoning by other drugs, medicaments and biological substances, intentional self-harm, initial encounter: Secondary | ICD-10-CM

## 2012-02-17 DIAGNOSIS — F603 Borderline personality disorder: Secondary | ICD-10-CM

## 2012-02-17 DIAGNOSIS — R630 Anorexia: Secondary | ICD-10-CM

## 2012-02-17 DIAGNOSIS — T465X1A Poisoning by other antihypertensive drugs, accidental (unintentional), initial encounter: Secondary | ICD-10-CM

## 2012-02-17 DIAGNOSIS — F1010 Alcohol abuse, uncomplicated: Secondary | ICD-10-CM

## 2012-02-17 DIAGNOSIS — F3160 Bipolar disorder, current episode mixed, unspecified: Principal | ICD-10-CM

## 2012-02-17 DIAGNOSIS — M549 Dorsalgia, unspecified: Secondary | ICD-10-CM

## 2012-02-17 DIAGNOSIS — Z888 Allergy status to other drugs, medicaments and biological substances status: Secondary | ICD-10-CM

## 2012-02-17 DIAGNOSIS — Z79899 Other long term (current) drug therapy: Secondary | ICD-10-CM

## 2012-02-17 DIAGNOSIS — T50901A Poisoning by unspecified drugs, medicaments and biological substances, accidental (unintentional), initial encounter: Secondary | ICD-10-CM

## 2012-02-17 DIAGNOSIS — F411 Generalized anxiety disorder: Secondary | ICD-10-CM

## 2012-02-17 LAB — BASIC METABOLIC PANEL
Chloride: 103 mEq/L (ref 96–112)
GFR calc Af Amer: >90 mL/min (ref >90)
GFR calc non Af Amer: >90 mL/min (ref >90)
Potassium: 3.2 mEq/L — ABNORMAL LOW (ref 3.5–5.1)
Sodium: 140 mEq/L (ref 135–145)

## 2012-02-17 LAB — CBC
HCT: 38.3 % (ref 36.0–46.0)
Hemoglobin: 12.8 g/dL (ref 12.0–15.0)
WBC: 9.2 10*3/uL (ref 4.0–10.5)

## 2012-02-17 MED ORDER — POTASSIUM CHLORIDE ER 10 MEQ PO TBCR: 20.0000 meq | EXTENDED_RELEASE_TABLET | Freq: Every day | ORAL | Status: DC

## 2012-02-17 MED ORDER — POTASSIUM CHLORIDE CRYS ER 20 MEQ PO TBCR
40.0000 meq | EXTENDED_RELEASE_TABLET | Freq: Once | ORAL | Status: AC
  Administered 2012-02-17: 40 meq via ORAL
  Filled 2012-02-17: qty 2

## 2012-02-17 MED ORDER — NICOTINE POLACRILEX 2 MG MT GUM
2.0000 mg | CHEWING_GUM | OROMUCOSAL | Status: DC | PRN
  Administered 2012-02-18 – 2012-02-19 (×4): 2 mg via ORAL

## 2012-02-17 MED ORDER — NABUMETONE 500 MG PO TABS
500.0000 mg | ORAL_TABLET | Freq: Two times a day (BID) | ORAL | Status: DC
  Administered 2012-02-18 – 2012-02-19 (×4): 500 mg via ORAL
  Filled 2012-02-17 (×7): qty 1

## 2012-02-17 NOTE — BH Assessment (Signed)
Assessment Note  PT IS A DIRECT ADMIT FROM MEDICAL FLOOR PER DR. IJAZ RASUL. PER DR SYED ARFEEN:  Patient is a 41 year old Caucasian married female who was admitted on the medical floor she took overdose of Klonopin. Patient took almost 30 tablets. Patient admitted she was very depressed and felt that her current psychiatric medication is not working. [facility] on Tegretol, Risperdal, clonidine and Librium. She admitted that for past few weeks she is not sleeping well and complained of racing thoughts and anger. She also admitted having attention with her husband. She's been married for one year however she believed the relationship is not going very well. She also admitted drinking and have 2 drinks before she took overdose. She told this is her third overdose in her life. She believed that the medication works. She denies any hallucination however endorsed severe mood swing anger and nervousness.  PER SUZANNE SIMPSON, LCSW:  Pt presents as very anxious, with pressured speech and rapid thought processes. Pt admits that she overdosed but denied that it was a suicide attempt.  She explains that she wasn't trying to kill herself, rather she just want to "fast-forward time" to her appt with her psychiatrist next week. Pt reports that she's been dx with Bipolar, Agoraphobia, OCD and Tourette's. Pt reports that she has been depressed since stopping her medications so that she could breastfeed.  She reports that she breastfed for 4 weeks and had trouble functioning without her Lithium, Risperidone, Librium and Xanax.  She reports that she stopped breastfeeding to return to her medication regimen and that she hasn't felt right since.  This has been for over 1 year.  Pt states that she is followed by Dr. Evelene Croon and that she has an appt next week.  She states that at her last appt, Dr. Evelene Croon mentioned that ECT may be an effective tx for Pt.  Pt is thinking about  getting a second opinion before she proceeds with this option.  Pt sees a therapist, Lanny Hurst, on an outpt basis, with last visit 2 weeks ago. Pt reports that she was at [facility] 1 month ago for 8 days due to SI.    Axis I: 296.80 Bipolar Disorder NOS Axis II: Deferred Axis III:  Past Medical History  Diagnosis Date  . IBS (irritable bowel syndrome)   . Anxiety   . Anorexia   . Bipolar affective disorder   . Back pain   . Depression   . Alcohol abuse    Axis IV: problems related to social environment and problems with primary support group Axis V: GAF=10  Past Medical History:  Past Medical History  Diagnosis Date  . IBS (irritable bowel syndrome)   . Anxiety   . Anorexia   . Bipolar affective disorder   . Back pain   . Depression   . Alcohol abuse     No past surgical history on file.  Family History: No family history on file.  Social History:  reports that she has been smoking.  She has never used smokeless tobacco. She reports that she drinks about 1.8 ounces of alcohol per week. She reports that she does not use illicit drugs.  Additional Social History:  Alcohol / Drug Use Pain Medications: Denies Prescriptions: Denies Over the Counter: Denies History of alcohol / drug use?: No history of alcohol / drug abuse Longest period of sobriety (when/how long): NA Allergies:  Allergies  Allergen Reactions  . Septra (Bactrim)  Other (See Comments)    Makes her uncomfortable    Home Medications:  Medications Prior to Admission  Medication Dose Route Frequency Provider Last Rate Last Dose  . haloperidol lactate (HALDOL) injection 5 mg  5 mg Intravenous Once Hurman Horn, MD   5 mg at 02/16/12 0039  . LORazepam (ATIVAN) injection 1 mg  1 mg Intravenous Once Hurman Horn, MD   1 mg at 02/16/12 0053  . potassium chloride SA (K-DUR,KLOR-CON) CR tablet 40 mEq  40 mEq Oral Once Jinger Neighbors, NP   40 mEq at 02/17/12 0646  . potassium chloride SA (K-DUR,KLOR-CON) CR tablet 40  mEq  40 mEq Oral Once Belkys A Regalado, MD   40 mEq at 02/17/12 1041  . sodium chloride 0.9 % bolus 1,000 mL  1,000 mL Intravenous Once Debby Crosley, MD   1,000 mL at 02/16/12 0054  . DISCONTD: 0.9 %  sodium chloride infusion   Intravenous Continuous Belkys A Regalado, MD 100 mL/hr at 02/16/12 0958    . DISCONTD: acetaminophen (TYLENOL) tablet 650 mg  650 mg Oral Q6H PRN Gery Pray, MD   650 mg at 02/17/12 1633  . DISCONTD: carbamazepine (TEGRETOL) chewable tablet 100 mg  100 mg Oral Daily Debby Crosley, MD   100 mg at 02/17/12 0912  . DISCONTD: carbamazepine (TEGRETOL) chewable tablet 100-200 mg  100-200 mg Oral BID Debby Crosley, MD      . DISCONTD: carbamazepine (TEGRETOL) chewable tablet 200 mg  200 mg Oral QHS Debby Crosley, MD   200 mg at 02/17/12 2139  . DISCONTD: enoxaparin (LOVENOX) injection 40 mg  40 mg Subcutaneous Q24H Debby Crosley, MD   40 mg at 02/16/12 0900  . DISCONTD: folic acid (FOLVITE) tablet 1 mg  1 mg Oral Daily Belkys A Regalado, MD   1 mg at 02/17/12 0913  . DISCONTD: influenza  inactive virus vaccine (FLUZONE/FLUARIX) injection 0.5 mL  0.5 mL Intramuscular Tomorrow-1000 Debby Crosley, MD      . DISCONTD: LORazepam (ATIVAN) injection 1 mg  1 mg Intravenous Q6H PRN Cristal , MD   1 mg at 02/17/12 2138  . DISCONTD: nicotine (NICODERM CQ - dosed in mg/24 hours) patch 21 mg  21 mg Transdermal Daily Belkys A Regalado, MD   21 mg at 02/17/12 1000  . DISCONTD: pneumococcal 23 valent vaccine (PNU-IMMUNE) injection 0.5 mL  0.5 mL Intramuscular Tomorrow-1000 Debby Crosley, MD      . DISCONTD: thiamine (VITAMIN B-1) tablet 100 mg  100 mg Oral Daily Belkys A Regalado, MD   100 mg at 02/17/12 0913   Medications Prior to Admission  Medication Sig Dispense Refill  . acetaminophen (TYLENOL) 500 MG tablet Take 1,000 mg by mouth every 6 (six) hours as needed. For pain.      Marland Kitchen ALPRAZolam (XANAX) 0.5 MG tablet Take 0.5 mg by mouth 3 (three) times daily as needed. For anxiety/panic  disorder.      . calcium-vitamin D (OSCAL) 250-125 MG-UNIT per tablet Take 1 tablet by mouth daily. For Vitamin D replacement and mood control  30 tablet  0  . carbamazepine (TEGRETOL) 100 MG chewable tablet Chew 100-200 mg by mouth 2 (two) times daily. 1 in am, 2 in pm      . chlordiazePOXIDE (LIBRIUM) 25 MG capsule Take 25 mg by mouth 3 (three) times daily.      . nabumetone (RELAFEN) 500 MG tablet Take 1 tablet (500 mg total) by mouth 2 (two) times daily. For  back pain  60 tablet  0  . potassium chloride (K-DUR) 10 MEQ tablet Take 2 tablets (20 mEq total) by mouth daily.  5 tablet  0  . traZODone (DESYREL) 50 MG tablet Take 1 tablet (50 mg total) by mouth at bedtime. For inosmnia  30 tablet  0    OB/GYN Status:  Patient's last menstrual period was 02/15/2012.  General Assessment Data Location of Assessment: [facility] Assessment Services Living Arrangements: Spouse/significant other Can pt return to current living arrangement?: Yes Admission Status: Involuntary Is patient capable of signing voluntary admission?: Yes Transfer from: Acute Hospital Referral Source: Medical Floor Inpatient  Education Status Is patient currently in school?: No  Risk to self Suicidal Ideation: Yes-Currently Present Suicidal Intent: Yes-Currently Present Is patient at risk for suicide?: Yes Suicidal Plan?: Yes-Currently Present Specify Current Suicidal Plan: Pt overdosed on approximately 30 tabs of Klonopin in suicide attempt Access to Means: Yes Specify Access to Suicidal Means: Access to Rx medications What has been your use of drugs/alcohol within the last 12 months?: Pt reports occasional alcohol use Previous Attempts/Gestures: Yes How many times?: 3  Other Self Harm Risks: None Triggers for Past Attempts: Unknown Intentional Self Injurious Behavior: None Family Suicide History: Unknown Recent stressful life event(s): Conflict (Comment) (Married 1 year and relationship not going well) Persecutory  voices/beliefs?: No Depression: Yes Depression Symptoms: Despondent;Feeling worthless/self pity;Feeling angry/irritable Substance abuse history and/or treatment for substance abuse?: No Suicide prevention information given to non-admitted patients: Not applicable  Risk to Others Homicidal Ideation: No Thoughts of Harm to Others: No Current Homicidal Intent: No Current Homicidal Plan: No Access to Homicidal Means: No Identified Victim: None History of harm to others?: No Assessment of Violence: None Noted Violent Behavior Description: None Does patient have access to weapons?: No Criminal Charges Pending?: No Does patient have a court date: No  Psychosis Hallucinations: None noted Delusions: None noted  Mental Status Report Appear/Hygiene: Disheveled Eye Contact: Fair Motor Activity: Unremarkable Speech: Logical/coherent;Rapid Level of Consciousness: Alert Mood: Depressed;Anxious Affect: Sad Anxiety Level: Minimal Thought Processes: Coherent;Relevant Judgement: Impaired Orientation: Person;Place;Time;Situation Obsessive Compulsive Thoughts/Behaviors: None  Cognitive Functioning Concentration: Normal Memory: Recent Intact;Remote Intact IQ: Average Insight: Poor Impulse Control: Poor Appetite: Fair Weight Loss: 0  Weight Gain: 0  Sleep: Decreased Total Hours of Sleep: 4  Vegetative Symptoms: None  Prior Inpatient Therapy Prior Inpatient Therapy: Yes Prior Therapy Dates: 01/2012 Prior Therapy Facilty/Provider(s): [facility] Reason for Treatment: Depression  Prior Outpatient Therapy Prior Outpatient Therapy: Yes Prior Therapy Dates: Current Prior Therapy Facilty/Provider(s): unknown Reason for Treatment: Depression          Abuse/Neglect Assessment (Assessment to be complete while patient is alone) Physical Abuse: Denies Verbal Abuse: Denies Sexual Abuse: Denies Exploitation of patient/patient's resources: Denies Self-Neglect: Denies           Additional Information 1:1 In Past 12 Months?: No CIRT Risk: No Elopement Risk: No Does patient have medical clearance?: Yes     Disposition:  Disposition Disposition of Patient: Inpatient treatment program Type of inpatient treatment program: Adult  On Site Evaluation by:   Reviewed with Physician: Wonda Cerise, MD    Patsy Baltimore, Harlin Rain 02/17/2012 11:29 PM

## 2012-02-17 NOTE — Progress Notes (Signed)
Report called to [facility], report called to Fransisca Kaufmann RN @ 2059.

## 2012-02-17 NOTE — Discharge Summary (Signed)
Admit date: 02/15/2012 Discharge date: 02/17/2012  Primary Care Physician:  Dorrene German, MD, MD   Discharge Diagnoses:    . Overdose drug, clonidine, intentional. 02/16/2012   . Suicidal intent 02/16/2012   . 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 Depression 02/16/2012   . Alcohol abuse 02/16/2012   . Bipolar disorder, current episode mixed 01/06/2012     Class: Acute        Hypokalemia.           DISCHARGE MEDICATION: Medication List  As of 02/17/2012  7:43 AM   STOP taking these medications         cloNIDine 0.1 MG tablet      cyclobenzaprine 10 MG tablet      hyoscyamine 0.125 MG tablet         TAKE these medications         acetaminophen 500 MG tablet   Commonly known as: TYLENOL   Take 1,000 mg by mouth every 6 (six) hours as needed. For pain.      ALPRAZolam 0.5 MG tablet   Commonly known as: XANAX   Take 0.5 mg by mouth 3 (three) times daily as needed. For anxiety/panic disorder.      calcium-vitamin D 250-125 MG-UNIT per tablet   Commonly known as: OSCAL   Take 1 tablet by mouth daily. For Vitamin D replacement and mood control      carbamazepine 100 MG chewable tablet   Commonly known as: TEGRETOL   Chew 100-200 mg by mouth 2 (two) times daily. 1 in am, 2 in pm      chlordiazePOXIDE 25 MG capsule   Commonly known as: LIBRIUM   Take 25 mg by mouth 3 (three) times daily.      nabumetone 500 MG tablet   Commonly known as: RELAFEN   Take 1 tablet (500 mg total) by mouth 2 (two) times daily. For back pain      traZODone 50 MG tablet   Commonly known as: DESYREL   Take 1 tablet (50 mg total) by mouth at bedtime. For inosmnia              Consults:  Psych     Recent Results (from the past 240 hour(s))  MRSA PCR SCREENING     Status: Normal   Collection Time   02/16/12  3:17 AM      Component Value Range Status Comment   MRSA by PCR NEGATIVE  NEGATIVE  Final     BRIEF ADMITTING H & P: History obtained mostly from ER physician as patient  is a somewhat unwilling historian. This is a 41 year old female who was brought in for clonidine overdose. She was apparently watching television when the husband noted that patient had an empty bottle of clonidine. Per report patient took approximately 25-35 0.1 mg tablets. It is unclear at what time she took medications. She has not received charcoal in the ER. The patient is alert and oriented, mildly bradycardic and mildly hypotensive. She has a very dry mouth. She was admitted here in February for suicidal ideations, since discharge she's continued to feel suicidal. She is married. Patient states that she does drink alcohol usually to self medicate. She states she stopped this for many years but recently restarted in January. She states she does smoke, she has a history of cocaine use and marijuana use in college. She states she occasionally buys pain pills from the streets. However she would give no detail on her clonidine intake.  Family is not present at bedside. Patient has a black and blue left eye, she states this was completely accidental  Hospital Course:   Overdose drug (02/16/2012)/Intentional Clonidine. Suicidal intent (02/16/2012) Patient was admitted to step down unit. She was started on IV fluids. She had one episode of hypotension which respond to fluids.  Psych consulted recommend in patient Psych admission. Tylenol and salicylates normal. Patient fluids were stop and her vitals remain stable. Poisson controled was contacted. Patient confirm that this was intentional overdose.   Bipolar disorder, current episode mixed (01/06/2012) I will restart her medication at discharge, but will defer to her psychiatrist continuation of this medications.    Alcohol abuse (02/16/2012)  Patient received  thiamione and folate. No DT during this admission.   Hypokalemia: this was replaced. Need a Bmet to follow K level.   Patient is stable to be transfer to [facility].  Disposition and Follow-up: Needs a  Bmet to follow K level. Discharge Orders    Future Orders Please Complete By Expires   Diet general      Increase activity slowly        Follow-up Information    Follow up with Dorrene German, MD .          DISCHARGE EXAM:  General: Alert, awake, oriented x3, in no acute distress.  HEENT: No bruits, no goiter.  Heart: Regular rate and rhythm, without murmurs, rubs, gallops.  Lungs: CTA, bilateral air movement.  Abdomen: Soft, nontender, nondistended, positive bowel sounds.  Neuro: Grossly intact, nonfocal.  Extremities; No edema.    Blood pressure 115/74, pulse 71, temperature 97.8 F (36.6 C), temperature source Oral, resp. rate 16, height 5\' 5"  (1.651 m), weight 53.4 kg (117 lb 11.6 oz), last menstrual period 02/15/2012, SpO2 99.00%.   Basename 02/17/12 0342 02/16/12 0529  NA 140 139  K 3.2* 4.0  CL 103 110  CO2 29 22  GLUCOSE 86 118*  BUN 5* 4*  CREATININE 0.58 0.54  CALCIUM 9.1 7.7*  MG -- --  PHOS -- --    Basename 02/15/12 2146  AST 13  ALT 11  ALKPHOS 44  BILITOT 0.1*  PROT 6.4  ALBUMIN 3.5    Basename 02/16/12 0529 02/15/12 2146  WBC 8.0 7.5  NEUTROABS -- 4.4  HGB 10.4* 11.5*  HCT 31.7* 34.3*  MCV 83.0 82.5  PLT 283 350    Signed: Domonick Sittner M.D. 02/17/2012, 7:43 AM

## 2012-02-17 NOTE — Progress Notes (Signed)
Pt transported to [facility] by  [facility] police @ 2145. Pt alert and oriented x 4 and in no physical distress. Vital signs within normal range. Pt left with her belongings.

## 2012-02-17 NOTE — Consult Note (Signed)
Patient Identification:  Barbara Shaw Date of Evaluation:  02/17/2012  Interval Hx;  Pt was seen. NO SI today but depressed. Willing to to go psy inpatient to get adjust her meds right now. Labs were reviewed with her primary MD who cleared medically today. K supplement was give today. HCT, HG has improved.    Past Medical History:     Past Medical History  Diagnosis Date  . IBS (irritable bowel syndrome)   . Anxiety   . Anorexia   . Bipolar affective disorder   . Back pain   . Depression   . Alcohol abuse       No past surgical history on file.  Allergies:  Allergies  Allergen Reactions  . Septra (Bactrim) Other (See Comments)    Makes her uncomfortable    Current Medications:  Prior to Admission medications   Medication Sig Start Date End Date Taking? Authorizing Provider  acetaminophen (TYLENOL) 500 MG tablet Take 1,000 mg by mouth every 6 (six) hours as needed. For pain.   Yes Historical Provider, MD  ALPRAZolam Prudy Feeler) 0.5 MG tablet Take 0.5 mg by mouth 3 (three) times daily as needed. For anxiety/panic disorder.   Yes Historical Provider, MD  calcium-vitamin D (OSCAL) 250-125 MG-UNIT per tablet Take 1 tablet by mouth daily. For Vitamin D replacement and mood control 01/12/12 01/11/13 Yes Mike Craze, MD  carbamazepine (TEGRETOL) 100 MG chewable tablet Chew 100-200 mg by mouth 2 (two) times daily. 1 in am, 2 in pm   Yes Historical Provider, MD  chlordiazePOXIDE (LIBRIUM) 25 MG capsule Take 25 mg by mouth 3 (three) times daily.   Yes Historical Provider, MD  cloNIDine (CATAPRES) 0.1 MG tablet Take 1.5 mg by mouth once.    Yes Historical Provider, MD  cyclobenzaprine (FLEXERIL) 10 MG tablet Take 10 mg by mouth 2 (two) times daily as needed. For muscle pain.   Yes Historical Provider, MD  hyoscyamine (LEVSIN, ANASPAZ) 0.125 MG tablet Take 1 tablet (0.125 mg total) by mouth 3 (three) times daily as needed. For IBS 01/12/12  Yes Mike Craze, MD  nabumetone (RELAFEN) 500  MG tablet Take 1 tablet (500 mg total) by mouth 2 (two) times daily. For back pain 01/12/12  Yes Mike Craze, MD  traZODone (DESYREL) 50 MG tablet Take 1 tablet (50 mg total) by mouth at bedtime. For inosmnia 01/12/12  Yes Mike Craze, MD    Social History:    reports that she has been smoking.  She has never used smokeless tobacco. She reports that she drinks about 1.8 ounces of alcohol per week. She reports that she does not use illicit drugs.   Family History:    No family history on file.  Mental Status Examination/Evaluation: Objective:  Appearance: Guarded  Psychomotor Activity:  Increased and Restlessness  Eye Contact::  Fair  Speech:  Pressured  Volume:  Increased  Mood:  Angry, Depressed and Irritable  Affect:  Depressed  Thought Process:  Relevant  Orientation:  Full  Thought Content: no Suicidal ideation  Suicidal Thoughts:  Yes.  with intent/plan  Homicidal Thoughts:  No  Judgement:  Impaired  Insight:  Lacking    DIAGNOSIS:   AXIS I   bipolar disorder   AXIS II  Deffered  AXIS III See medical notes.  AXIS IV problems related to social environment and problems with primary support group  AXIS V 1-10 persistent dangerousness to self and others present     Assessment/Plan:  The  patient continued to exhibit symptoms of severe depression. She remains emotional and labile. Patient is at risk to harm herself. Patient [facility] be [facility] to [facility]. Will repeat chem 8 tomorrow at psy unit. Primary MD agreed to start her on KCL 10-20 meq QD. Was cleared by medicine team.    ROS   Physical Exam

## 2012-02-17 NOTE — Progress Notes (Signed)
Patient ID: Barbara Shaw, female   DOB: [birth year], 41 y.o.   MRN: [id number] Patient admitted involuntarily after overdosing on clonidine. She was in the ICU after her suicide attempt. She reports being on the medication for Tourette's. Patient had been feeling overwhelmed and had been suffering from agoraphobia. Patient was here at [facility] last month. She was very anxious during the admission process and appeared somewhat manic. Patient reports that she has not been receiving her lithium. However, it was not on her PTA medication list. Patient was cooperative with the admission process. She requested to take a shower before going to bed. Oriented to the 500 hall unit and routine.

## 2012-02-18 DIAGNOSIS — F1010 Alcohol abuse, uncomplicated: Secondary | ICD-10-CM

## 2012-02-18 DIAGNOSIS — F3110 Bipolar disorder, current episode manic without psychotic features, unspecified: Secondary | ICD-10-CM

## 2012-02-18 LAB — COMPREHENSIVE METABOLIC PANEL
ALT: 20 U/L (ref 0–35)
Alkaline Phosphatase: 51 U/L (ref 39–117)
BUN: 7 mg/dL (ref 6–23)
CO2: 30 mEq/L (ref 19–32)
Chloride: 98 mEq/L (ref 96–112)
GFR calc Af Amer: >90 mL/min (ref >90)
GFR calc non Af Amer: >90 mL/min (ref >90)
Glucose, Bld: 91 mg/dL (ref 70–99)
Potassium: 4 mEq/L (ref 3.5–5.1)
Sodium: 136 mEq/L (ref 135–145)
Total Bilirubin: 0.1 mg/dL — ABNORMAL LOW (ref 0.3–1.2)
Total Protein: 7.1 g/dL (ref 6.0–8.3)

## 2012-02-18 MED ORDER — POTASSIUM CHLORIDE CRYS ER 20 MEQ PO TBCR
20.0000 meq | EXTENDED_RELEASE_TABLET | Freq: Every day | ORAL | Status: DC
  Administered 2012-02-18 – 2012-02-19 (×2): 20 meq via ORAL
  Filled 2012-02-18 (×3): qty 1

## 2012-02-18 MED ORDER — ALUM & MAG HYDROXIDE-SIMETH 200-200-20 MG/5ML PO SUSP: 30.0000 mL | ORAL | Status: DC | PRN

## 2012-02-18 MED ORDER — TRAZODONE HCL 50 MG PO TABS
50.0000 mg | ORAL_TABLET | Freq: Every day | ORAL | Status: DC
  Administered 2012-02-18 (×2): 50 mg via ORAL
  Filled 2012-02-18 (×5): qty 1

## 2012-02-18 MED ORDER — TRAZODONE HCL 100 MG PO TABS
100.0000 mg | ORAL_TABLET | Freq: Every day | ORAL | Status: DC
  Administered 2012-02-18: 100 mg via ORAL
  Filled 2012-02-18 (×2): qty 1

## 2012-02-18 MED ORDER — LITHIUM CARBONATE 150 MG PO CAPS
150.0000 mg | ORAL_CAPSULE | Freq: Two times a day (BID) | ORAL | Status: DC
  Filled 2012-02-18 (×4): qty 1

## 2012-02-18 MED ORDER — CYCLOBENZAPRINE HCL 5 MG PO TABS
5.0000 mg | ORAL_TABLET | Freq: Two times a day (BID) | ORAL | Status: DC
  Administered 2012-02-18 – 2012-02-19 (×2): 5 mg via ORAL
  Filled 2012-02-18 (×5): qty 1

## 2012-02-18 MED ORDER — MAGNESIUM HYDROXIDE 400 MG/5ML PO SUSP: 30.0000 mL | Freq: Every day | ORAL | Status: DC | PRN

## 2012-02-18 MED ORDER — CHLORDIAZEPOXIDE HCL 25 MG PO CAPS
25.0000 mg | ORAL_CAPSULE | Freq: Three times a day (TID) | ORAL | Status: DC | PRN
  Administered 2012-02-18 – 2012-02-19 (×5): 25 mg via ORAL
  Filled 2012-02-18 (×5): qty 1

## 2012-02-18 MED ORDER — LITHIUM CARBONATE 150 MG PO CAPS
150.0000 mg | ORAL_CAPSULE | Freq: Every day | ORAL | Status: DC
  Administered 2012-02-18: 150 mg via ORAL
  Filled 2012-02-18 (×3): qty 1

## 2012-02-18 MED ORDER — LOPERAMIDE HCL 2 MG PO CAPS
2.0000 mg | ORAL_CAPSULE | ORAL | Status: DC | PRN
  Administered 2012-02-18: 2 mg via ORAL

## 2012-02-18 MED ORDER — TRAZODONE HCL 50 MG PO TABS: 50.0000 mg | ORAL_TABLET | Freq: Every day | ORAL | Status: DC

## 2012-02-18 MED ORDER — ACETAMINOPHEN 325 MG PO TABS
650.0000 mg | ORAL_TABLET | Freq: Four times a day (QID) | ORAL | Status: DC | PRN
  Administered 2012-02-19: 650 mg via ORAL

## 2012-02-18 MED ORDER — LITHIUM CARBONATE ER 300 MG PO TBCR
300.0000 mg | EXTENDED_RELEASE_TABLET | Freq: Every day | ORAL | Status: DC
  Administered 2012-02-18: 300 mg via ORAL
  Filled 2012-02-18 (×2): qty 1

## 2012-02-18 NOTE — H&P (Signed)
Psychiatric Admission Assessment Adult  Patient Identification:  Barbara Shaw Date of Evaluation:  02/18/2012 41yo MWF CC: was drinking and overdosed on clonidine  History of Present Illness:: Says she was drinking as she missed her mother and overdosed on her clonidine. Speech is rapid and she wants to be discharged as she's wasted 2 days in ICU. Wants to see her mother Tuesday when [place].Also request that Lithium be increased to 300mg  a day. Wants discharge to work her steps and be with her sponsor. Has a L nabalck and bue eye which was a n accident. Patient was with [place] 2/1=01/12/12     Past Psychiatric History: Diagnosed as Bipolar age 17 currently in care with Dr.Rapundar Evelene Croon  Substance Abuse History:  Social History:    reports that she has been smoking.  She has never used smokeless tobacco. She reports that she drinks about 1.8 ounces of alcohol per week. She reports that she does not use illicit drugs. Currently abusing alcohol some SA in college   Family Psych History: Denies   Past Medical History:     Past Medical History  Diagnosis Date  . IBS (irritable bowel syndrome)   . Anxiety   . Anorexia   . Bipolar affective disorder   . Back pain   . Depression   . Alcohol abuse       No past surgical history on file.  Allergies:  Allergies  Allergen Reactions  . Septra (Bactrim) Other (See Comments)    Makes her uncomfortable    Current Medications:  Prior to Admission medications   Medication Sig Start Date End Date Taking? Authorizing Provider  acetaminophen (TYLENOL) 500 MG tablet Take 1,000 mg by mouth every 6 (six) hours as needed. For pain.    Historical Provider, MD  ALPRAZolam Prudy Feeler) 0.5 MG tablet Take 0.5 mg by mouth 3 (three) times daily as needed. For anxiety/panic disorder.    Historical Provider, MD  calcium-vitamin D (OSCAL) 250-125 MG-UNIT per tablet Take 1 tablet by mouth daily. For Vitamin D replacement  and mood control 01/12/12 01/11/13  Mike Craze, MD  carbamazepine (TEGRETOL) 100 MG chewable tablet Chew 100-200 mg by mouth 2 (two) times daily. 1 in am, 2 in pm    Historical Provider, MD  chlordiazePOXIDE (LIBRIUM) 25 MG capsule Take 25 mg by mouth 3 (three) times daily.    Historical Provider, MD  nabumetone (RELAFEN) 500 MG tablet Take 1 tablet (500 mg total) by mouth 2 (two) times daily. For back pain 01/12/12   Mike Craze, MD  potassium chloride (K-DUR) 10 MEQ tablet Take 2 tablets (20 mEq total) by mouth daily. 02/17/12 02/16/13  Belkys A Regalado, MD  traZODone (DESYREL) 50 MG tablet Take 1 tablet (50 mg total) by mouth at bedtime. For inosmnia 01/12/12   Mike Craze, MD    Mental Status Examination/Evaluation: Objective:  Appearance: Neat  Psychomotor Activity:  Normal  Eye Contact::  Good  Speech:  Pressured  Volume:  Normal  Mood: hypomanic    Affect:  Constricted  Thought Process: clear rational goal oriented - increase Li and go to [employer]   Orientation:  Full  Thought Content: says she has thoughts not voices per se   Suicidal Thoughts:  No  Homicidal Thoughts:  No  Judgement:  Impaired  Insight:  Fair    DIAGNOSIS:    AXIS I Alcohol Abuse Bipolar -still hypomanic   AXIS II Borderline Personality Dis.  AXIS III See medical history.  AXIS IV problems related to social environment and problems with primary support group  AXIS V 41-50 serious symptoms     Treatment Plan Summary: Admit for further med adjustments increase Lithium to  300mg  daily  Has outside therapist Psychiatrist and AA sponsor

## 2012-02-18 NOTE — Progress Notes (Signed)
[facility] Adult Inpatient Family/Significant Other Suicide Prevention Education  Suicide Prevention Education:  Contact Attempts:Hal  Steinert-[phone]-(pt.'s husband) has been identified by the patient as the family member/significant other with whom the patient will be residing, and identified as the person(s) who will aid the patient in the event of a mental health crisis.  With written consent from the patient, two attempts were made to provide suicide prevention education, prior to and/or following the patient's discharge.  We were unsuccessful in providing suicide prevention education.  A suicide education pamphlet was given to the patient to share with family/significant other.  Date and time of first attempt: by Lamar Blinks on 02/18/12 at 3:12 p.m. Date and time of second attempt:  Neila Gear 02/18/2012, 3:12 PM

## 2012-02-18 NOTE — Progress Notes (Signed)
Adult Psychosocial Assessment Update Interdisciplinary Team  Previous [facility] admissions/discharges:  Admissions Discharges  Date:  01-05-12 Date:  01-12-12  Date: Date:  Date: Date:  Date: Date:  Date: Date:   Changes since the last Psychosocial Assessment (including adherence to outpatient mental health and/or substance abuse treatment, situational issues contributing to decompensation and/or relapse). Pt. Reports being on a emotional high after leaving, but quickly got worse and states the medications. Pt. reports  Drinking and trying to take pills to commit  Suicide. Pt. states she attends AA and reports a problem with alcohol.. Pt. Wants to attend women AA meetings. Pt. Will  Reconnect with sponser             Discharge Plan 1. Will you be returning to the same living situation after discharge?   Yes:  No:  x  If no, what is your plan?    Pt. Will go with her husband to stay with her in laws until she is stable.       2. Would you like a referral for services when you are discharged? Yes:  x   If yes, for what services?  No:        Pt is seen by Dr. Evelene Croon and therapist Zachery Conch       Summary and Recommendations (to be completed by the evaluator) Pt.  Is a 41 year old female admitted for Bipolar and SI attempt. Pt. Was d/c from [facility] on  01-12-12. The pt.  States she was on a emotional high after helping some former friends at [facility], and quickly got worse and feels her medications were not working. The  Pt. Reports trying to overdose on medication and having two specific plans for SI prior to coming to [facility] this time. The pt. Is seen by Dr. Evelene Croon and by therapist Lanny Hurst. Pt. Would like follow up scheduled there. Pt. Recommendations include: crisis stabilization, case management, group therapy, and medication management.                     Signature:  Neila Gear, 02/18/2012 2:07 PM
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
## 2012-02-18 NOTE — Progress Notes (Signed)
[facility] Group Notes:  (Counselor/Nursing/MHT/Case Management/Adjunct)  02/18/2012 1315  Type of Therapy:  Group Therapy  Participation Level:  Active  Participation Quality:  Appropriate  Affect:  Appropriate  Cognitive:  Appropriate  Insight:  Good  Engagement in Group:  Good  Engagement in Therapy:  Good  Modes of Intervention:  Activity, Clarification, Problem-solving and Support  Summary of Progress/Problems:  Pt. participated in group session on supports. Pt. was asked what support means to them, who are their supports, what is the difference between unhealthy and healthy supports and what they can do when their support is not there. Pt stated that she expects her encouragement from her supports, and described unhealthy support as "bitter". Pt states that her mother supports her by educating herself about treatment and about her diagnosis. Pt stated that she pushes her mother away because she feels like a burden. Pt states that her husband is also a support to her.   Crosswell, Desiree 02/18/2012, 3:35 PM

## 2012-02-18 NOTE — Progress Notes (Signed)
[facility] Group Notes:  (Counselor/Nursing/MHT/Case Management/Adjunct)  02/18/2012 0830  Type of Therapy:  Discharge Planning  Participation Level:  Active  Summary of Progress/Problems: Pt. attended and participated in aftercare planning group. [facility] support group information was given as well as information on suicide prevention information, warning signs to look for with suicide and crisis line numbers to use. Pt stated that she is at [facility] for SI. Pt stated that she was drinking heavily and watching an old [employer] and began to having thoughts of suicide. Pt states that she would like to stop drinking because drinking triggers her SI. Pt also states that she has not been compliant with her medications to regulate her Bipolar Disorder. Pt denies SI/HI.   Crosswell, Desiree 02/18/2012, 3:19 PM

## 2012-02-18 NOTE — Progress Notes (Signed)
02/18/2012 Nursing D 1600 Barbara Shaw has adjusted well to being in the hospital. She is pleasant and cooperative and hypomanic, at times, but  Is aware of hospital routines and is adept at asking this nurse for what she needs and then waiting for it. She is  Talkative, at times. She makes good eye contact. She completed her self inventory and on it she wrote she denied SI, she rated her depression and hopelessness " 2 /1 /" and stated her DC plan includes to see more family and to communicate better when feelings low". A SHe is medicated  per MD order and is compliant with her POC. R Safety is maintained and POC includes fostering therapeutic relationship already established PD RN [facility]

## 2012-02-18 NOTE — H&P (Signed)
  Pt was seen by me today and I agree with the key elements documented in H&P.  

## 2012-02-18 NOTE — Progress Notes (Signed)
[facility] Adult Inpatient Family/Significant Other Suicide Prevention Education  Suicide Prevention Education:  Education Completed; Addalie Calvey-[phone]-  has been identified by the patient as the family member/significant other with whom the patient will be residing, and identified as the person(s) who will aid the patient in the event of a mental health crisis (suicidal ideations/suicide attempt).  With written consent from the patient, the family member/significant other has been provided the following suicide prevention education, prior to the and/or following the discharge of the patient.  The suicide prevention education provided includes the following:  Suicide risk factors  Suicide prevention and interventions  National Suicide Hotline telephone number  [facility] assessment telephone number  [facility] Emergency Assistance 911  [facility] and/or Residential Mobile Crisis Unit telephone number  Request made of family/significant other to:  Remove weapons (e.g., guns, rifles, knives), all items previously/currently identified as safety concern.  Pt.'s husband stated that guns have been removed from the home and that he and the pt. And child will be going to stay at a in laws home temporarily. Pt. Has secured and made sure that home is safe also.  Remove drugs/medications (over-the-counter, prescriptions, illicit drugs), all items previously/currently identified as a safety concern. Pt.'s husband has removed the pt.'s medication but states he takes that medication also but will lock up his and all other medication in a lock box and secure the home. Also, one of the pt.'s plans to commit SI was to pour charcoal into the bath tub and light it. Pt. States that he has removed the charcoal from the home and secured the home.  Pt.'s husband state that the plan is to go stay with his in laws temporarily for support for a short time, and that he and pt. Will attend AA  meeting to deal with addiction issues. Pt.'s husband state that he is in recovery also and is supportive of the pt. Pt.'s husband discussed medication changes since coming to [facility] .  The family member/significant other verbalizes understanding of the suicide prevention education information provided.  The family member/significant other agrees to remove the items of safety concern listed above.  Lamar Blinks [place] 02/18/2012, 3:47 PM

## 2012-02-19 DIAGNOSIS — F3160 Bipolar disorder, current episode mixed, unspecified: Principal | ICD-10-CM

## 2012-02-19 MED ORDER — CARBAMAZEPINE 100 MG PO CHEW: 100.0000 mg | CHEWABLE_TABLET | Freq: Two times a day (BID) | ORAL | Status: DC

## 2012-02-19 MED ORDER — LITHIUM CARBONATE ER 300 MG PO TBCR: 300.0000 mg | EXTENDED_RELEASE_TABLET | Freq: Every day | ORAL | Status: DC

## 2012-02-19 MED ORDER — CHLORDIAZEPOXIDE HCL 25 MG PO CAPS: 25.0000 mg | ORAL_CAPSULE | Freq: Three times a day (TID) | ORAL | Status: AC

## 2012-02-19 MED ORDER — CYCLOBENZAPRINE HCL 5 MG PO TABS: 5.0000 mg | ORAL_TABLET | Freq: Two times a day (BID) | ORAL | Status: AC

## 2012-02-19 MED ORDER — TRAZODONE HCL 50 MG PO TABS: 100.0000 mg | ORAL_TABLET | Freq: Every day | ORAL | Status: DC

## 2012-02-19 NOTE — Progress Notes (Addendum)
[facility] Group Notes:  (Counselor/Nursing/MHT/Case Management/Adjunct)    Type of Therapy:  Group Therapy  Participation Level:  Did Not Attend Ailana came to group, then stated, "If this is just about meditation and relaxation I'd rather go back to my room and nap since I haven't slept in four days," and left.       [name] 02/19/2012  2:35 PM

## 2012-02-19 NOTE — Progress Notes (Signed)
[facility] Group Notes:  (Counselor/Nursing/MHT/Case Management/Adjunct)  02/19/2012 11:57 AM  Type of Therapy:  Group Therapy  Participation Level:  Active  Participation Quality:  Appropriate  Affect:  Appropriate  Cognitive:  Appropriate  Insight:  Good  Engagement in Group:  Good  Engagement in Therapy:  Good  Modes of Intervention:  Support  Summary of Progress/Problems:Patient participated actively in group, contributing often and at times interrupting other group members. At one point, she apologized, explaining that she was in a manic state.    Mendleson, Katelyne Galster 02/19/2012, 11:57 AM

## 2012-02-19 NOTE — Progress Notes (Signed)
Patient has been up and visible in the milieu this morning.  Attending groups and interacting well with staff and peers.  Speech is rapid and pressured with flight of ideas.  She has been a bit irritable and argumentative.  She wants to be discharged.  She asked about why she was not receiving Tegretal and why her Flexeril was only at 5 mg.  Discussed with Dr. Dan Humphreys.
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
## 2012-02-19 NOTE — Progress Notes (Signed)
Patient ID: Barbara Shaw, female   DOB: 03/26/1971, 41 y.o.   MRN: [id number] The patient was in a manic state this evening. Her speech was pressured and rapid. Hyperactive, racing about the unit. Accidentally cut her leg shaving trying to hurry out of the shower to answer a phone call. Denied any suicidal ideation.

## 2012-02-19 NOTE — Tx Team (Signed)
Interdisciplinary Treatment Plan Update (Adult)  Date:  02/19/2012  Time Reviewed:  10:17 AM   Progress in Treatment: Attending groups: Yes Participating in groups:  Yes, very hyperverbal and monopolizing Taking medication as prescribed:  Yes Tolerating medication: Yes Family/Significant othe contact made:  Yes, contact made with: husband, Hal Patient understands diagnosis: Yes Discussing patient identified problems/goals with staff:  Yes Medical problems stabilized or resolved: Yes Denies suicidal/homicidal ideation: Yes Issues/concerns per patient self-inventory:  No  Other:  New problem(s) identified: None  Reason for Continuation of Hospitalization: Mania  Interventions implemented related to continuation of hospitalization:  Medication stabilization, safety checks q 15 mins, group attendance  Additional comments:  Estimated length of stay:  1-2 days  Discharge Plan: Discharge home and follow up with Dr Evelene Croon and Lanny Hurst at [facility]  New goal(s):  Review of initial/current patient goals per problem list:   1.  Goal(s):Decrease depressive symptoms  Met:  Yes  Target date: by discharge  As evidenced by: Efraim Kaufmann will rate depression at lower than 4 (rates at 1 today)  2.  Goal (s):Decrease anxiety symptoms  Met:  Yes  Target date: by discharge  As evidenced by: Deziah will rate anxiety at lower than 4 (rates at 3 today)  3.  Goal(s): Medication stabilization  Met:  Yes  Target date: by discharge  As evidenced by: Buelah reports decrease in symptoms and no intolerable side effects  Attendees: Patient:  Barbara Shaw 02/19/2012 10:17 AM  Family:     Physician:  Dr Orson Aloe, MD 02/19/2012 10:17 AM  Nursing:   Quintella Reichert, RN 02/19/2012 10:17 AM  Case Manager:  Juline Patch, LCSW 02/19/2012 10:17 AM  Counselor:  Angus Palms, LCSW 02/19/2012 10:17 AM  Other:  Reyes Ivan, LCSWA 02/19/2012 10:17 AM  Other:  Serena Colonel, NP  02/19/2012 10:17 AM  Other:  Charlyne Mom, doctoral counseling intern 02/19/2012 10:17 AM  Other:      Scribe for Treatment Team:   [name], 02/19/2012 10:17 AM

## 2012-02-19 NOTE — Discharge Summary (Signed)
Physician Discharge Summary Note  Patient:  Barbara Shaw is an 41 y.o., female MRN:  [id number] DOB:  Mar 17, 1971 Patient phone:  [phone] (home)  Patient address:   [address] [place] [address],   Date of Admission:  02/17/2012 Date of Discharge: 02/19/12  Reason for Admission: Drug overdose  Discharge Diagnoses: Active Problems:  * No active hospital problems. *    Axis Diagnosis:   AXIS I:  Bipolar, mixed AXIS II:  Deferred AXIS III:   Past Medical History  Diagnosis Date  . IBS (irritable bowel syndrome)   . Anxiety   . Anorexia   . Bipolar affective disorder   . Back pain   . Depression   . Alcohol abuse    AXIS IV:  other psychosocial or environmental problems and problems related to social environment AXIS V:  60  Level of Care:  OP  Hospital Course: Says she was drinking as she missed her mother and overdosed on her clonidine. Speech is rapid and she wants to be discharged as she's wasted 2 days in ICU. Wants to see her mother tuesday when [place]. Also request that Lithium be increased to 300mg  a day. Wants discharge to work her steps and be with her sponsor. Has a Lt. black and bue eye which was an accident. Patient was with [place] 01/05/12 thru 01/12/12.   Patient was in this hospital from 02/17/12 thru the morning of 02/19/12. She attended the treatment team this am and requested to be discharged as she does not want to miss any more of her AA meetings. Patient stated that after being discharged from this hospital on 01/12/12, she stopped attending to her needs and keep putting her AA meetings aside. She added that her attempt to overdose on clonidine after drinking too much  is just an out of character for her. She added that this would not have happened if it has not been the alcohol. She stated that she has so much to live for, her son and her family in general. Patient related to the treatment team that she has good  family support, and that her father in law has made an effort to have a psychiatrist from the [facility] hospital be available for her any time that she needed help.  Today, patient presents with rapid speech and overly talkative as well. She has flight of ideas. She has Lt. black eye. She maintained that she is not benefiting from the AA meetings being held in this hospital. She adamantly denies suicidal/homicidal ideations, auditory and or visual hallucinations. When asked what she learned from this hospital this time around, patient answered; "there are Mental Health Association meetings that might be very helpful for her". She is discharged to her home with husband. She will continue outpatient care with Dr. Evelene Croon on 02/26/12. The address, date and time of this appointment provided for patient. Patient left [facility] with all personal belongings via family transport in no apparent distress.    Consults:  None  Significant Diagnostic Studies:  None  Discharge Vitals:   Blood pressure 87/53, pulse 92, temperature 97.8 F (36.6 C), temperature source Oral, resp. rate 16, height 5\' 4"  (1.626 m), weight 54.432 kg (120 lb), last menstrual period 02/15/2012.  Mental Status Exam: See Mental Status Examination and Suicide Risk Assessment completed by Attending Physician prior to discharge.  Discharge destination:  Home  Is patient on multiple antipsychotic therapies at discharge:  No  Has Patient had three or more failed trials of antipsychotic monotherapy by history:  No  Recommended Plan for Multiple Antipsychotic Therapies: NA   Medication List  As of 02/19/2012  2:10 PM   STOP taking these medications         ALPRAZolam 0.5 MG tablet      potassium chloride 10 MEQ tablet         TAKE these medications      Indication    acetaminophen 500 MG tablet   Commonly known as: TYLENOL   Take 1,000 mg by mouth every 6 (six) hours as needed. For pain.       calcium-vitamin D 250-125 MG-UNIT per  tablet   Commonly known as: OSCAL   Take 1 tablet by mouth daily. For Vitamin D replacement and mood control       carbamazepine 100 MG chewable tablet   Commonly known as: TEGRETOL   Chew 1-2 tablets (100-200 mg total) by mouth 2 (two) times daily. 1 in am, 2 in pm For mood control.       chlordiazePOXIDE 25 MG capsule   Commonly known as: LIBRIUM   Take 1 capsule (25 mg total) by mouth 3 (three) times daily. For anxiety       cyclobenzaprine 5 MG tablet   Commonly known as: FLEXERIL   Take 1 tablet (5 mg total) by mouth 2 (two) times daily. For management of back pain.       lithium carbonate 300 MG CR tablet   Commonly known as: LITHOBID   Take 1 tablet (300 mg total) by mouth at bedtime. For mood control.       nabumetone 500 MG tablet   Commonly known as: RELAFEN   Take 1 tablet (500 mg total) by mouth 2 (two) times daily. For back pain       traZODone 50 MG tablet   Commonly known as: DESYREL   Take 2 tablets (100 mg total) by mouth at bedtime. For inosmnia            Follow-up Information    Follow up with Dr. Evelene Croon on 02/26/2012. (You are scheduled to see Dr. Evelene Croon at [facility] on Monday, February 26, 2012)    Contact information:   [address] [place], [place]  [address]         Follow-up recommendations:  Other:  Keep all scheduled follow-up appoinments as recommended.  Comments:  Take all medications as prescribed.                       Report any adverse effects of medications to your outpatient provider promptly.  SignedArmandina Stammer I 02/19/2012, 2:10 PM

## 2012-02-19 NOTE — Discharge Summary (Signed)
I agree with this D/C Summary.  

## 2012-02-19 NOTE — Progress Notes (Signed)
Patient eager for DC. Denies SI/HI or psychosis. Reviewed suicide prevention information and patient verbalized understanding. Patient given medications kept in med room as well as prescription. AVS reviewed and signed. Release of information signed. No further questions.

## 2012-02-19 NOTE — Progress Notes (Signed)
[facility] Case Management Discharge Plan:  Will you be returning to the same living situation after discharge: Yes,  Barbara Shaw will return home with her husband At discharge, do you have transportation home?:Yes,  Husband will transport her home Do you have the ability to pay for your medications:Yes,  Barbara Shaw has Medicaid  Interagency Information:     Release of information consent forms completed and in the chart;  Patient's signature needed at discharge.  Patient to Follow up at:  Follow-up Information    Follow up with Dr. Evelene Croon on 02/26/2012. (You are scheduled to see Dr. Evelene Croon at [facility] on Monday, February 26, 2012)    Contact information:   [address] [place], [place]  [address]         Patient denies SI/HI:   Yes,  Patient denies SI    Safety Planning and Suicide Prevention discussed:  Yes,  Reviewed during discharge planning group  Barrier to discharge identified:No.  Summary and Recommendations:  Follow up with AA and outpatient appointments   Barbara Shaw, Joesph July 02/19/2012, 10:59 AM

## 2012-02-21 LAB — CARBAMAZEPINE, FREE AND TOTAL
Carbamazepine Metabolite -: 0.6 ug/mL (ref 0.2–2.0)
Carbamazepine, Free: 0.5 ug/mL — ABNORMAL LOW (ref 1.0–3.0)

## 2012-02-23 NOTE — Progress Notes (Signed)
Patient Discharge Instructions:  Psychiatric Admission Assessment Note Provided,  02/22/2012 After Visit Summary (AVS) Provided,  02/22/2012 Face Sheet Provided, 02/22/2012 Faxed/Sent to the Next Level Care provider:  02/22/2012 Sent Suicide Risk Assessment - Discharge Assessment 02/22/2012  Faxed to [facility] @ [phone]  Wandra Scot, 02/23/2012, 4:59 PM

## 2013-03-07 ENCOUNTER — Encounter: Payer: Self-pay | Admitting: Obstetrics

## 2013-05-14 ENCOUNTER — Ambulatory Visit: Payer: Self-pay | Admitting: Obstetrics

## 2014-07-02 ENCOUNTER — Ambulatory Visit (INDEPENDENT_AMBULATORY_CARE_PROVIDER_SITE_OTHER): Payer: Medicaid Other | Admitting: Neurology

## 2014-07-02 ENCOUNTER — Encounter: Payer: Self-pay | Admitting: Neurology

## 2014-07-02 VITALS — BP 118/73 | HR 130 | Ht 66.75 in | Wt 103.0 lb

## 2014-07-02 DIAGNOSIS — R4189 Other symptoms and signs involving cognitive functions and awareness: Secondary | ICD-10-CM

## 2014-07-02 DIAGNOSIS — F09 Unspecified mental disorder due to known physiological condition: Secondary | ICD-10-CM

## 2014-07-02 NOTE — Patient Instructions (Signed)
Overall you are doing fairly well but I do want to suggest a few things today:   Remember to drink plenty of fluid, eat healthy meals and do not skip any meals. Try to eat protein with a every meal and eat a healthy snack such as fruit or nuts in between meals. Try to keep a regular sleep-wake schedule and try to exercise daily, particularly in the form of walking, 20-30 minutes a day, if you can.   As far as diagnostic testing:  1)Please have some blood work completed today 2)I would like you to have a MRI of the brain. You will be called to schedule this 3)I would like you to follow up with neuro-psychology for formal testing. You will be called to schedule this  Follow up once workup completed. Please call us with any interim questions, concerns, problems, updates or refill requests.   My clinical assistant and will answer any of your questions and relay your messages to me and also relay most of my messages to you.   Our phone number is [phone]. We also have an after hours call service for urgent matters and there is a physician on-call for urgent questions. For any emergencies you know to call 911 or go to the nearest emergency room

## 2014-07-02 NOTE — Progress Notes (Signed)
[facility]    Provider:  Dr Hosie PoissonSumner Referring Provider: Dorrene GermanAvbuere, Edwin A, MD Primary Care Physician:  Dorrene GermanAVBUERE,EDWIN A, MD  CC:  Cognitive decline  HPI:  Barbara Shaw is a 43 y.o. female here as a referral from Dr. Concepcion ElkAvbuere for cognitive decline. Notes it has been ongoing for years, diagnosed with fibromyalgia 2 years ago and memory issues started around then. Notes difficulty with getting her words out. Difficulty finding the right words, sometimes she talks and it does not make sense. Has trouble recalling what she is doing on a daily basis. She forgets where she puts things. Also has some difficulty with remote memory. She is currently driving occasionally, notes she gets lost sometimes. She notes her parents manage her finances.   Has history of chronic pain syndrome, bipolar disorder, fibromyalgia. Takes narcotics on a prn basis for pain. Lyrica was recently d/c over concern this would be causing her cognitive decline. She notes no improvement in memory since stopping the Lyrica.   Notes she has a family history of Alzheimers disease.   Review of Systems: Out of a complete 14 system review, the patient complains of only the following symptoms, and all other reviewed systems are negative. + fatigue, confusion, tremor, feeling hot, feeling cold, shortness of breath, allergies, runny nose  History   Social History  . Marital Status: Married    Spouse Name: Lollie SailsHarry     Number of Children: 1  . Years of Education: 12+   Occupational History  . Not on file.   Social History Main Topics  . Smoking status: Current Every Day Smoker -- 1.50 packs/day for 20 years  . Smokeless tobacco: Never Used  . Alcohol Use: 1.8 oz/week    3 Glasses of wine per week  . Drug Use: No  . Sexual Activity: Yes    Birth Control/ Protection: IUD   Other Topics Concern  . Not on file   Social History Narrative   Patient lives at home with husband Lollie SailsHarry   Patient has 1 child   Patient has a college education.    Patient is right handed.           History reviewed. No pertinent family history.  Past Medical History  Diagnosis Date  . IBS (irritable bowel syndrome)   . Anxiety   . Anorexia   . Bipolar affective disorder   . Back pain   . Depression   . Alcohol abuse     History reviewed. No pertinent past surgical history.  Current Outpatient Prescriptions  Medication Sig Dispense Refill  . acetaminophen (TYLENOL) 500 MG tablet Take 1,000 mg by mouth every 6 (six) hours as needed. For pain.      Marland Kitchen. amphetamine-dextroamphetamine (ADDERALL) 20 MG tablet Take 20 mg by mouth 3 (three) times daily.      . chlordiazePOXIDE (LIBRIUM) 25 MG capsule Take 1 capsule (25 mg total) by mouth 3 (three) times daily. For anxiety  90 capsule  0  . cyclobenzaprine (FLEXERIL) 10 MG tablet Take 10 mg by mouth 3 (three) times daily as needed for muscle spasms.      Marland Kitchen. etodolac (LODINE) 400 MG tablet Take 400 mg by mouth daily.      Marland Kitchen. lithium 300 MG tablet Take 300 mg by mouth 2 (two) times daily.      Marland Kitchen. MAGNESIUM PO Take by mouth.      . Oxycodone HCl 10 MG TABS Take 10 mg by mouth 4 (four) times daily.      .Marland Kitchen  lithium carbonate (LITHOBID) 300 MG CR tablet Take 1 tablet (300 mg total) by mouth at bedtime. For mood control.  30 tablet  0  . [DISCONTINUED] cloNIDine (CATAPRES) 0.1 MG tablet Take 1.5 mg by mouth once.       . [DISCONTINUED] hyoscyamine (LEVSIN, ANASPAZ) 0.125 MG tablet Take 1 tablet (0.125 mg total) by mouth 3 (three) times daily as needed. For IBS  30 tablet  0   No current facility-administered medications for this visit.    Allergies as of 07/02/2014 - Review Complete 07/02/2014  Allergen Reaction Noted  . Septra [bactrim] Other (See Comments) 11/08/2011    Vitals: BP 118/73  Pulse 130  Ht 5' 6.75" (1.695 m)  Wt 103 lb (46.72 kg)  BMI 16.26 kg/m2 Last Weight:  Wt Readings from Last 1 Encounters:  07/02/14 103 lb (46.72 kg)   Last Height:   Ht  Readings from Last 1 Encounters:  07/02/14 5' 6.75" (1.695 m)     Physical exam: Exam: Gen: NAD, conversant Eyes: anicteric sclerae, moist conjunctivae HENT: Atraumatic, oropharynx clear Neck: Trachea midline; supple,  Lungs: CTA, no wheezing, rales, rhonic                          CV: RRR, no MRG Abdomen: Soft, non-tender;  Extremities: No peripheral edema  Skin: Normal temperature, no rash,  Psych: Appropriate affect, pleasant  Neuro: MS: pressure speech, talks without taking break. Goes off on tangents Oriented to name and location. Has difficulty following commands (likely due to poor attention as she is very easily distracted)  CN: PERRL, EOMI no nystagmus, no ptosis, sensation intact to LT V1-V3 bilat, face symmetric, no weakness, hearing grossly intact, palate elevates symmetrically, shoulder shrug 5/5 bilat,  tongue protrudes midline, no fasiculations noted.  Motor: normal bulk and tone Strength: 5/5  In all extremities  Coord: rapid alternating and point-to-point (FNF, HTS) movements intact. No rest tremor, mild bilateral postural and intention tremor  Reflexes: symmetrical, bilat downgoing toes  Sens: LT intact in all extremities  Gait: posture, stance, stride and arm-swing normal. Tandem gait intact. Able to walk on heels and toes. Romberg absent.   Assessment:  After physical and neurologic examination, review of laboratory studies, imaging, neurophysiology testing and pre-existing records, assessment will be reviewed on the problem list.  Plan:  Treatment plan and additional workup will be reviewed under Problem List.  1)cognitive decline 2)fibromyalgia 3)chronic pain 4)bipolar  43y/o woman presenting for initial evaluation of progressive cognitive decline. Unclear etiology of her symptoms. Suspect stress/anxiety/depression is playing a role. She is on multiple medications that can contribute to a cognitive decline. She does have a family history of  Alzheimer's disease so a neurodgenerative process would also be in the differential though less likely based on history and presentation. Will check B12, MMA, TSH, MRI brain and refer to neuro-pscyh for formal cognitive testing. Follow up once workup completed.   Elspeth Cho, DO  [facility] [address] [place], [place] [address]  Phone [phone] Fax [phone]

## 2014-07-03 DIAGNOSIS — R4189 Other symptoms and signs involving cognitive functions and awareness: Secondary | ICD-10-CM | POA: Insufficient documentation

## 2014-07-06 ENCOUNTER — Other Ambulatory Visit: Payer: Self-pay | Admitting: Neurology

## 2014-07-06 DIAGNOSIS — E0590 Thyrotoxicosis, unspecified without thyrotoxic crisis or storm: Secondary | ICD-10-CM

## 2014-07-06 LAB — TSH: TSH: 0.339 u[IU]/mL — AB (ref 0.450–4.500)

## 2014-07-06 LAB — METHYLMALONIC ACID, SERUM: METHYLMALONIC ACID: 124 nmol/L (ref 0–378)

## 2014-07-06 LAB — VITAMIN B12: VITAMIN B 12: 419 pg/mL (ref 211–946)

## 2014-07-06 LAB — HGB A1C W/O EAG: Hgb A1c MFr Bld: 5.7 % — ABNORMAL HIGH (ref 4.8–5.6)

## 2014-07-08 ENCOUNTER — Telehealth: Payer: Self-pay | Admitting: *Deleted

## 2014-07-08 NOTE — Telephone Encounter (Signed)
Message copied by Avie EchevariaMOORE, Keymoni Mccaster S on Wed Jul 08, 2014  3:43 PM ------      Message from: Ramond MarrowSUMNER, PETER J      Created: Mon Jul 06, 2014  9:31 AM       Please let her know her thyroid test was mildly abnormal. I would like her to have some additional blood work to see if this is clinically significant or just a normal level for her. She can stop by and have the blood work done whenever it is convenient for her. Thanks. ------

## 2014-07-08 NOTE — Telephone Encounter (Signed)
LVM with test results as well as Dr Minus BreedingSumner's request and instructions to draw additional labs. Lab hours were given.

## 2014-07-12 ENCOUNTER — Inpatient Hospital Stay: Admission: RE | Admit: 2014-07-12 | Payer: Self-pay | Source: Ambulatory Visit

## 2014-07-20 ENCOUNTER — Other Ambulatory Visit: Payer: Self-pay | Admitting: *Deleted

## 2014-07-20 ENCOUNTER — Other Ambulatory Visit (INDEPENDENT_AMBULATORY_CARE_PROVIDER_SITE_OTHER): Payer: Self-pay

## 2014-07-20 DIAGNOSIS — Z0289 Encounter for other administrative examinations: Secondary | ICD-10-CM

## 2014-07-20 DIAGNOSIS — F09 Unspecified mental disorder due to known physiological condition: Secondary | ICD-10-CM

## 2014-07-20 NOTE — Progress Notes (Signed)
Needing new orders since last orders could not be released.

## 2014-07-20 NOTE — Addendum Note (Signed)
Addended byHermenia Fiscal: Jalisia Puchalski on: 07/20/2014 04:32 PM   Modules accepted: Orders

## 2014-07-21 ENCOUNTER — Telehealth: Payer: Self-pay | Admitting: *Deleted

## 2014-07-21 LAB — T3, FREE: T3, Free: 2.4 pg/mL (ref 2.0–4.4)

## 2014-07-21 LAB — T4, FREE: Free T4: 1.22 ng/dL (ref 0.82–1.77)

## 2014-07-21 NOTE — Telephone Encounter (Signed)
Normal thyroid should follow up with pcp.

## 2014-11-06 ENCOUNTER — Encounter: Payer: Self-pay | Admitting: Psychology

## 2014-11-06 DIAGNOSIS — F3162 Bipolar disorder, current episode mixed, moderate: Secondary | ICD-10-CM

## 2014-11-06 DIAGNOSIS — R413 Other amnesia: Secondary | ICD-10-CM

## 2015-03-07 ENCOUNTER — Ambulatory Visit
Admission: RE | Admit: 2015-03-07 | Discharge: 2015-03-07 | Disposition: A | Discharge status: Home / Self Care | Payer: No Typology Code available for payment source | Source: Ambulatory Visit | Attending: Neurology | Admitting: Neurology

## 2015-03-07 DIAGNOSIS — R4189 Other symptoms and signs involving cognitive functions and awareness: Secondary | ICD-10-CM | POA: Diagnosis not present

## 2015-03-16 ENCOUNTER — Encounter: Payer: Self-pay | Admitting: Diagnostic Neuroimaging

## 2015-03-16 ENCOUNTER — Ambulatory Visit (INDEPENDENT_AMBULATORY_CARE_PROVIDER_SITE_OTHER): Payer: Medicaid Other | Admitting: Diagnostic Neuroimaging

## 2015-03-16 VITALS — Ht 65.0 in | Wt 117.2 lb

## 2015-03-16 DIAGNOSIS — G894 Chronic pain syndrome: Secondary | ICD-10-CM

## 2015-03-16 DIAGNOSIS — F3177 Bipolar disorder, in partial remission, most recent episode mixed: Secondary | ICD-10-CM | POA: Diagnosis not present

## 2015-03-16 DIAGNOSIS — R413 Other amnesia: Secondary | ICD-10-CM | POA: Diagnosis not present
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
## 2015-03-25 ENCOUNTER — Encounter (HOSPITAL_COMMUNITY): Payer: Self-pay | Admitting: Nurse Practitioner

## 2015-03-25 ENCOUNTER — Emergency Department (HOSPITAL_COMMUNITY)
Admission: EM | Admit: 2015-03-25 | Discharge: 2015-03-25 | Disposition: A | Discharge status: Home / Self Care | Payer: Medicaid Other | Attending: Emergency Medicine | Admitting: Emergency Medicine

## 2015-03-25 DIAGNOSIS — F1510 Other stimulant abuse, uncomplicated: Secondary | ICD-10-CM | POA: Diagnosis not present

## 2015-03-25 DIAGNOSIS — Z3202 Encounter for pregnancy test, result negative: Secondary | ICD-10-CM | POA: Diagnosis not present

## 2015-03-25 DIAGNOSIS — R443 Hallucinations, unspecified: Secondary | ICD-10-CM | POA: Diagnosis present

## 2015-03-25 DIAGNOSIS — Z8719 Personal history of other diseases of the digestive system: Secondary | ICD-10-CM | POA: Insufficient documentation

## 2015-03-25 DIAGNOSIS — R55 Syncope and collapse: Secondary | ICD-10-CM | POA: Insufficient documentation

## 2015-03-25 DIAGNOSIS — F419 Anxiety disorder, unspecified: Secondary | ICD-10-CM | POA: Insufficient documentation

## 2015-03-25 DIAGNOSIS — Z720 Tobacco use: Secondary | ICD-10-CM | POA: Insufficient documentation

## 2015-03-25 DIAGNOSIS — Z79899 Other long term (current) drug therapy: Secondary | ICD-10-CM | POA: Insufficient documentation

## 2015-03-25 DIAGNOSIS — F319 Bipolar disorder, unspecified: Secondary | ICD-10-CM | POA: Insufficient documentation

## 2015-03-25 DIAGNOSIS — F1110 Opioid abuse, uncomplicated: Secondary | ICD-10-CM | POA: Diagnosis not present

## 2015-03-25 DIAGNOSIS — F1310 Sedative, hypnotic or anxiolytic abuse, uncomplicated: Secondary | ICD-10-CM | POA: Diagnosis not present

## 2015-03-25 LAB — I-STAT TROPONIN, ED: Troponin i, poc: 0 ng/mL (ref 0.00–0.08)

## 2015-03-25 LAB — CBC
HCT: 35.8 % — ABNORMAL LOW (ref 36.0–46.0)
Hemoglobin: 11.6 g/dL — ABNORMAL LOW (ref 12.0–15.0)
MCH: 28.7 pg (ref 26.0–34.0)
MCHC: 32.4 g/dL (ref 30.0–36.0)
MCV: 88.6 fL (ref 78.0–100.0)
PLATELETS: 276 10*3/uL (ref 150–400)
RBC: 4.04 MIL/uL (ref 3.87–5.11)
RDW: 13.3 % (ref 11.5–15.5)
WBC: 10.3 10*3/uL (ref 4.0–10.5)

## 2015-03-25 LAB — RAPID URINE DRUG SCREEN, HOSP PERFORMED
Amphetamines: POSITIVE — AB
BARBITURATES: NOT DETECTED
Benzodiazepines: POSITIVE — AB
Cocaine: NOT DETECTED
Opiates: POSITIVE — AB
TETRAHYDROCANNABINOL: NOT DETECTED

## 2015-03-25 LAB — COMPREHENSIVE METABOLIC PANEL
ALBUMIN: 3.9 g/dL (ref 3.5–5.2)
ALK PHOS: 54 U/L (ref 39–117)
ALT: 15 U/L (ref 0–35)
AST: 25 U/L (ref 0–37)
Anion gap: 6 (ref 5–15)
BUN: 7 mg/dL (ref 6–23)
CO2: 25 mmol/L (ref 19–32)
Calcium: 9.1 mg/dL (ref 8.4–10.5)
Chloride: 108 mmol/L (ref 96–112)
Creatinine, Ser: 0.74 mg/dL (ref 0.50–1.10)
GFR calc Af Amer: >90 mL/min (ref >90)
GFR calc non Af Amer: >90 mL/min (ref >90)
GLUCOSE: 90 mg/dL (ref 70–99)
Potassium: 3.7 mmol/L (ref 3.5–5.1)
Sodium: 139 mmol/L (ref 135–145)
Total Bilirubin: 0.2 mg/dL — ABNORMAL LOW (ref 0.3–1.2)
Total Protein: 6.5 g/dL (ref 6.0–8.3)

## 2015-03-25 LAB — ETHANOL: ALCOHOL ETHYL (B): 56 mg/dL — AB (ref 0–9)

## 2015-03-25 LAB — ACETAMINOPHEN LEVEL: Acetaminophen (Tylenol), Serum: <10 ug/mL — ABNORMAL LOW (ref 10–30)

## 2015-03-25 LAB — SALICYLATE LEVEL: Salicylate Lvl: <4 mg/dL (ref 2.8–20.0)

## 2015-03-25 LAB — PREGNANCY, URINE: Preg Test, Ur: NEGATIVE

## 2015-03-25 NOTE — Discharge Instructions (Signed)
Syncope °Syncope is a medical term for fainting or passing out. This means you lose consciousness and drop to the ground. People are generally unconscious for less than 5 minutes. You may have some muscle twitches for up to 15 seconds before waking up and returning to normal. Syncope occurs more often in older adults, but it can happen to anyone. While most causes of syncope are not dangerous, syncope can be a sign of a serious medical problem. It is important to seek medical care.  °CAUSES  °Syncope is caused by a sudden drop in blood flow to the brain. The specific cause is often not determined. Factors that can bring on syncope include: °· Taking medicines that lower blood pressure. °· Sudden changes in posture, such as standing up quickly. °· Taking more medicine than prescribed. °· Standing in one place for too long. °· Seizure disorders. °· Dehydration and excessive exposure to heat. °· Low blood sugar (hypoglycemia). °· Straining to have a bowel movement. °· Heart disease, irregular heartbeat, or other circulatory problems. °· Fear, emotional distress, seeing blood, or severe pain. °SYMPTOMS  °Right before fainting, you may: °· Feel dizzy or light-headed. °· Feel nauseous. °· See all white or all black in your field of vision. °· Have cold, clammy skin. °DIAGNOSIS  °Your health care provider will ask about your symptoms, perform a physical exam, and perform an electrocardiogram (ECG) to record the electrical activity of your heart. Your health care provider may also perform other heart or blood tests to determine the cause of your syncope which may include: °· Transthoracic echocardiogram (TTE). During echocardiography, sound waves are used to evaluate how blood flows through your heart. °· Transesophageal echocardiogram (TEE). °· Cardiac monitoring. This allows your health care provider to monitor your heart rate and rhythm in real time. °· Holter monitor. This is a portable device that records your  heartbeat and can help diagnose heart arrhythmias. It allows your health care provider to track your heart activity for several days, if needed. °· Stress tests by exercise or by giving medicine that makes the heart beat faster. °TREATMENT  °In most cases, no treatment is needed. Depending on the cause of your syncope, your health care provider may recommend changing or stopping some of your medicines. °HOME CARE INSTRUCTIONS °· Have someone stay with you until you feel stable. °· Do not drive, use machinery, or play sports until your health care provider says it is okay. °· Keep all follow-up appointments as directed by your health care provider. °· Lie down right away if you start feeling like you might faint. Breathe deeply and steadily. Wait until all the symptoms have passed. °· Drink enough fluids to keep your urine clear or pale yellow. °· If you are taking blood pressure or heart medicine, get up slowly and take several minutes to sit and then stand. This can reduce dizziness. °SEEK IMMEDIATE MEDICAL CARE IF:  °· You have a severe headache. °· You have unusual pain in the chest, abdomen, or back. °· You are bleeding from your mouth or rectum, or you have black or tarry stool. °· You have an irregular or very fast heartbeat. °· You have pain with breathing. °· You have repeated fainting or seizure-like jerking during an episode. °· You faint when sitting or lying down. °· You have confusion. °· You have trouble walking. °· You have severe weakness. °· You have vision problems. °If you fainted, call your local emergency services (911 in [place] Do not drive   yourself to the hospital.  °MAKE SURE YOU: °· Understand these instructions. °· Will watch your condition. °· Will get help right away if you are not doing well or get worse. °Document Released: 11/20/2005 Document Revised: 11/25/2013 Document Reviewed: 01/19/2012 °ExitCare® Patient Information ©2015 [employer]. This information is not intended to replace  advice given to you by your health care provider. Make sure you discuss any questions you have with your health care provider. ° °

## 2015-03-25 NOTE — ED Provider Notes (Signed)
CSN: [id number]     Arrival date & time 03/25/15  1811 History   First MD Initiated Contact with Patient 03/25/15 1832     Chief Complaint  Patient presents with  . Hallucinations     (Consider location/radiation/quality/duration/timing/severity/associated sxs/prior Treatment) HPI Barbara Shaw is a 44 year old female with past medical history of IBS, anxiety, bipolar disorder, alcohol abuse who presents the ER by EMS after being found lying in the trunk of her car. EMS was called by her 83-year-old son. During interview patient's speech is rapid and pressured, patient describes having some recent syncopal episodes, and states she has also been experiencing frequent amnesia for which she is going to see a neurologist for an has MRI scheduled for same. Patient states she does not recall any of the events that happened today that led up to her son calling 911. Patient states she does recall walking around her yard, speaking to the paramedics, and agreeing to come to the emergency room for further evaluation. During interview patient denies having any specific complaints. Patient states she has chronic pain, and some other chronic medical issues which she states are not changed from her baseline. Specifically, patient denies dizziness, weakness, lightheadedness, headache, blurred vision, neck pain, chest pain, shortness of breath, abdominal pain, nausea, vomiting, diarrhea, dysuria.  Past Medical History  Diagnosis Date  . IBS (irritable bowel syndrome)   . Anxiety   . Anorexia   . Bipolar affective disorder   . Back pain   . Depression   . Alcohol abuse    History reviewed. No pertinent past surgical history. History reviewed. No pertinent family history. History  Substance Use Topics  . Smoking status: Current Every Day Smoker -- 1.50 packs/day for 20 years  . Smokeless tobacco: Never Used  . Alcohol Use: 1.8 oz/week    3 Glasses of wine per week   OB History    No data available      Review of Systems  Constitutional: Negative for fever.  HENT: Negative for trouble swallowing.   Eyes: Negative for visual disturbance.  Respiratory: Negative for shortness of breath.   Cardiovascular: Negative for chest pain.  Gastrointestinal: Negative for nausea, vomiting and abdominal pain.  Genitourinary: Negative for dysuria.  Musculoskeletal: Negative for neck pain.  Skin: Negative for rash.  Neurological: Negative for dizziness, weakness and numbness.  Psychiatric/Behavioral: The patient is hyperactive.       Allergies  Septra  Home Medications   Prior to Admission medications   Medication Sig Start Date End Date Taking? Authorizing Provider  ALPRAZolam Prudy Feeler) 1 MG tablet Take 1 mg by mouth 3 (three) times daily as needed for anxiety (anxiety).  02/13/15  Yes Historical Provider, MD  amphetamine-dextroamphetamine (ADDERALL) 20 MG tablet Take 20 mg by mouth 3 (three) times daily.   Yes Historical Provider, MD  chlordiazePOXIDE (LIBRIUM) 25 MG capsule Take 1 capsule (25 mg total) by mouth 3 (three) times daily. For anxiety Patient taking differently: Take 25 mg by mouth 2 (two) times daily. For anxiety 02/19/12  Yes Mike Craze, MD  cyclobenzaprine (FLEXERIL) 10 MG tablet Take 10 mg by mouth 2 (two) times daily.    Yes Historical Provider, MD  etodolac (LODINE) 400 MG tablet Take 400 mg by mouth 2 (two) times daily.    Yes Historical Provider, MD  hyoscyamine (LEVSIN SL) 0.125 MG SL tablet Take 0.125 mg by mouth every 6 (six) hours as needed for cramping (cramping).  12/19/14  Yes Historical Provider, MD  lithium 300 MG tablet Take 300-600 mg by mouth 2 (two) times daily. Take 2 tablets (600 mg) in the am and Take 1 tablet (300 mg) at night.   Yes Historical Provider, MD  Oxycodone HCl 10 MG TABS Take 10 mg by mouth 4 (four) times daily.   Yes Historical Provider, MD  Vitamin D, Ergocalciferol, (DRISDOL) 50000 UNITS CAPS capsule Take 50,000 Units by mouth every 7 (seven)  days.  01/20/15  Yes Historical Provider, MD   BP 116/81 mmHg  Pulse 102  Temp(Src) 98.1 F (36.7 C) (Oral)  Resp 22  SpO2 100%  LMP 03/01/2015 Physical Exam  Constitutional: She is oriented to person, place, and time. She appears well-developed and well-nourished. No distress.  HENT:  Head: Normocephalic and atraumatic.  Mouth/Throat: Oropharynx is clear and moist. No oropharyngeal exudate.  Eyes: Right eye exhibits no discharge. Left eye exhibits no discharge. No scleral icterus.  Neck: Normal range of motion and full passive range of motion without pain. Neck supple. No spinous process tenderness and no muscular tenderness present. No rigidity. No edema, no erythema and normal range of motion present. No Brudzinski's sign and no Kernig's sign noted.  Cardiovascular: Normal rate, regular rhythm and normal heart sounds.   No murmur heard. Pulmonary/Chest: Effort normal and breath sounds normal. No respiratory distress.  Abdominal: Soft. There is no tenderness.  Musculoskeletal: Normal range of motion. She exhibits no edema or tenderness.  Neurological: She is alert and oriented to person, place, and time. She has normal strength. No cranial nerve deficit or sensory deficit. Coordination normal. GCS eye subscore is 4. GCS verbal subscore is 5. GCS motor subscore is 6.  Patient fully alert, answering questions appropriately in full, clear sentences. Cranial nerves II through XII grossly intact. Motor strength 5 out of 5 in all major muscle groups of upper and lower extremities. Distal sensation intact.   Skin: Skin is warm and dry. No rash noted. She is not diaphoretic.  Psychiatric:  Patient's mood is euthymic with affect appropriate to mood. Speech is rapid and pressured. Speech is communicative. Pavi are is slightly hyperactive, patient does not appear to be responding to any internal stimuli. Thought content is not endorsing any thoughts of suicide or homicide. Judgment and insight are  appropriate.  Nursing note and vitals reviewed.   ED Course  Procedures (including critical care time) Labs Review Labs Reviewed  CBC - Abnormal; Notable for the following:    Hemoglobin 11.6 (*)    HCT 35.8 (*)    All other components within normal limits  COMPREHENSIVE METABOLIC PANEL - Abnormal; Notable for the following:    Total Bilirubin 0.2 (*)    All other components within normal limits  ETHANOL - Abnormal; Notable for the following:    Alcohol, Ethyl (B) 56 (*)    All other components within normal limits  ACETAMINOPHEN LEVEL - Abnormal; Notable for the following:    Acetaminophen (Tylenol), Serum <10.0 (*)    All other components within normal limits  URINE RAPID DRUG SCREEN (HOSP PERFORMED) - Abnormal; Notable for the following:    Opiates POSITIVE (*)    Benzodiazepines POSITIVE (*)    Amphetamines POSITIVE (*)    All other components within normal limits  SALICYLATE LEVEL  PREGNANCY, URINE  I-STAT TROPOININ, ED    Imaging Review No results found.   EKG Interpretation   Date/Time:  Thursday March 25 2015 18:15:04 EDT Ventricular Rate:  95 PR Interval:  148 QRS Duration: 95  QT Interval:  397 QTC Calculation: 499 R Axis:   87 Text Interpretation:  Sinus rhythm Borderline T wave abnormalities  Confirmed by Lincoln Brighamees, Liz [id number]) on 03/25/2015 9:45:43 PM      MDM   Final diagnoses:  Syncope, unspecified syncope type    Patient here after a possible syncopal episode today. Nursing notes report hallucinations and patient found in the trunk of her car. I'm not sure where these are coming from. There is no evidence on history with patient or discussing with her family in the room of anything remotely posterior patient having hallucinations. Family also reports the patient was not in fact found in the trunk of her car but she had a single episode while getting out of the back seat. Husband in the room reports that he found patient with her knees on the ground  lying flat on the side of the seat. He states that as he got patient up from this position she was able to stand on her own, and was initially slightly confused. He states she quickly regained her mental status. Alert and oriented 4. Patient states she does remember this incident. Patient on exam has rapid pressured speech, however it is communicative and non-tangential. Speaking with patient and her husband they state that this is completely at baseline for patient, and is not unusual for her. Patient persistently denies any suicidal or homicidal ideation area patient persistently denies having any feelings that she is a threat to herself or anyone else. Patient's husband agrees with this statement, and states he does not feel she is at any threat, their only concern about these several episodes of syncope she has been having. Of note patient does have positive alcohol, however is clinically sober. There is no evidence of psychosis today. Patient's neurologic exam is benign. Patient did not hit her head, is denying any pain in the ER. Patient is completely asymptomatic in the ER. Orthostatic vital signs are negative. EKG without evidence of injury or ectopy. Troponin negative. No concern for ACS. Wells criteria less than 4, at very low risk for PE. Clinically no concern for PE. A multiple discussions with patient, she remains asymptomatic throughout her ER stay. She is also hemodynamically stable throughout her stay. There is no concern for encephalopathy. Abdominal exam is benign. No concern for acute abdomen. I offered psychiatric evaluation because of her rapid speech, however patient again reiterated the fact that she feels that baseline for her, and her husband agrees with this. They state they'll prefer to follow up with her psychiatrist as an outpatient. Patient to be discharged at this time. Return precautions discussed, and strongly encouraged patient to follow up with her primary care physician. Patient  verbalizes understanding and agreement with this plan. I encouraged patient to call or return to the ER if any worsening symptoms or should she have any questions or concerns.  BP 116/81 mmHg  Pulse 102  Temp(Src) 98.1 F (36.7 C) (Oral)  Resp 22  SpO2 100%  LMP 03/01/2015  Signed,  Ladona MowJoe Chandra Asher, PA-C 11:32 PM  Patient discussed with Dr. Tilden FossaElizabeth Rees, MD    Ladona MowJoe Kimm Ungaro, PA-C 03/25/15 2332  Tilden FossaElizabeth Rees, MD 03/25/15 [phone]
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
## 2015-03-25 NOTE — ED Notes (Signed)
DC IV R Hand

## 2015-03-25 NOTE — ED Notes (Signed)
Bed: WA17 Expected date:  Expected time:  Means of arrival:  Comments: EMS- 44yo F, AMS/Psych

## 2015-08-25 ENCOUNTER — Emergency Department (HOSPITAL_COMMUNITY)
Admission: EM | Admit: 2015-08-25 | Discharge: 2015-08-25 | Disposition: A | Discharge status: Home / Self Care | Payer: Medicaid Other | Attending: Physician Assistant | Admitting: Physician Assistant

## 2015-08-25 ENCOUNTER — Encounter (HOSPITAL_COMMUNITY): Payer: Self-pay

## 2015-08-25 DIAGNOSIS — F319 Bipolar disorder, unspecified: Secondary | ICD-10-CM | POA: Insufficient documentation

## 2015-08-25 DIAGNOSIS — Z8719 Personal history of other diseases of the digestive system: Secondary | ICD-10-CM | POA: Insufficient documentation

## 2015-08-25 DIAGNOSIS — Y9389 Activity, other specified: Secondary | ICD-10-CM | POA: Insufficient documentation

## 2015-08-25 DIAGNOSIS — Z3202 Encounter for pregnancy test, result negative: Secondary | ICD-10-CM | POA: Diagnosis not present

## 2015-08-25 DIAGNOSIS — Z720 Tobacco use: Secondary | ICD-10-CM | POA: Diagnosis not present

## 2015-08-25 DIAGNOSIS — T401X1A Poisoning by heroin, accidental (unintentional), initial encounter: Secondary | ICD-10-CM

## 2015-08-25 DIAGNOSIS — F419 Anxiety disorder, unspecified: Secondary | ICD-10-CM | POA: Diagnosis not present

## 2015-08-25 DIAGNOSIS — Y9289 Other specified places as the place of occurrence of the external cause: Secondary | ICD-10-CM | POA: Insufficient documentation

## 2015-08-25 DIAGNOSIS — Z79899 Other long term (current) drug therapy: Secondary | ICD-10-CM | POA: Diagnosis not present

## 2015-08-25 DIAGNOSIS — Y998 Other external cause status: Secondary | ICD-10-CM | POA: Diagnosis not present

## 2015-08-25 HISTORY — DX: Other psychoactive substance dependence, uncomplicated: F19.20

## 2015-08-25 LAB — CBC
HCT: 37.3 % (ref 36.0–46.0)
HEMOGLOBIN: 11.9 g/dL — AB (ref 12.0–15.0)
MCH: 28.7 pg (ref 26.0–34.0)
MCHC: 31.9 g/dL (ref 30.0–36.0)
MCV: 89.9 fL (ref 78.0–100.0)
Platelets: 284 10*3/uL (ref 150–400)
RBC: 4.15 MIL/uL (ref 3.87–5.11)
RDW: 14 % (ref 11.5–15.5)
WBC: 17.1 10*3/uL — ABNORMAL HIGH (ref 4.0–10.5)

## 2015-08-25 LAB — COMPREHENSIVE METABOLIC PANEL
ALK PHOS: 44 U/L (ref 38–126)
ALT: 22 U/L (ref 14–54)
ANION GAP: 7 (ref 5–15)
AST: 37 U/L (ref 15–41)
Albumin: 4.3 g/dL (ref 3.5–5.0)
BILIRUBIN TOTAL: 0.4 mg/dL (ref 0.3–1.2)
BUN: 8 mg/dL (ref 6–20)
CALCIUM: 8.3 mg/dL — AB (ref 8.9–10.3)
CO2: 23 mmol/L (ref 22–32)
CREATININE: 0.6 mg/dL (ref 0.44–1.00)
Chloride: 106 mmol/L (ref 101–111)
GFR calc Af Amer: >60 mL/min (ref >60)
GFR calc non Af Amer: >60 mL/min (ref >60)
GLUCOSE: 99 mg/dL (ref 65–99)
Potassium: 3.4 mmol/L — ABNORMAL LOW (ref 3.5–5.1)
Sodium: 136 mmol/L (ref 135–145)
TOTAL PROTEIN: 7.3 g/dL (ref 6.5–8.1)

## 2015-08-25 LAB — I-STAT BETA HCG BLOOD, ED (MC, WL, AP ONLY): I-stat hCG, quantitative: <5 m[IU]/mL (ref <5)

## 2015-08-25 LAB — ACETAMINOPHEN LEVEL: Acetaminophen (Tylenol), Serum: <10 ug/mL — ABNORMAL LOW (ref 10–30)

## 2015-08-25 LAB — ETHANOL: Alcohol, Ethyl (B): 54 mg/dL — ABNORMAL HIGH (ref <5)

## 2015-08-25 LAB — SALICYLATE LEVEL: Salicylate Lvl: <4 mg/dL (ref 2.8–30.0)

## 2015-08-25 LAB — CBG MONITORING, ED: GLUCOSE-CAPILLARY: 98 mg/dL (ref 65–99)

## 2015-08-25 MED ORDER — SODIUM CHLORIDE 0.9 % IV BOLUS (SEPSIS)
1000.0000 mL | Freq: Once | INTRAVENOUS | Status: AC
Start: 2015-08-25 — End: 2015-08-25
  Administered 2015-08-25: 1000 mL via INTRAVENOUS

## 2015-08-25 NOTE — ED Notes (Signed)
MD at bedside. 

## 2015-08-25 NOTE — ED Notes (Signed)
Bed: RESA Expected date:  Expected time:  Means of arrival:  Comments: EMS 6F Unresponsive, given Narcan, VSS

## 2015-08-25 NOTE — ED Notes (Signed)
Per [facility]- Bystander witnessed pt unresponsive. Pt received as Resp. Arrest. Given Narcan  nasal by fire with BVM support. SPCO2 37 with BVM. Recovered after 10 minutes of support. Pt at present alert and active with care. #18 IV LAC. Zofran  IVP given in route. Heroin suspected supplies at [facility]. Pt admits to cocaine. Pt admits to opiate use.

## 2015-08-25 NOTE — Discharge Instructions (Signed)
Accidental Overdose °A drug overdose occurs when a chemical substance (drug or medication) is used in amounts large enough to overcome a person. This may result in severe illness or death. This is a type of poisoning. Accidental overdoses of medications or other substances come from a variety of reasons. When this happens accidentally, it is often because the person taking the substance does not know enough about what they have taken. Drugs which commonly cause overdose deaths are alcohol, psychotropic medications (medications which affect the mind), pain medications, illegal drugs (street drugs) such as cocaine and heroin, and multiple drugs taken at the same time. It may result from careless behavior (such as over-indulging at a party). Other causes of overdose may include multiple drug use, a lapse in memory, or drug use after a period of no drug use.  °Sometimes overdosing occurs because a person cannot remember if they have taken their medication.  °A common unintentional overdose in young children involves multi-vitamins containing iron. Iron is a part of the hemoglobin molecule in blood. It is used to transport oxygen to living cells. When taken in small amounts, iron allows the body to restock hemoglobin. In large amounts, it causes problems in the body. If this overdose is not treated, it can lead to death. °Never take medicines that show signs of tampering or do not seem quite right. Never take medicines in the dark or in poor lighting. Read the label and check each dose of medicine before you take it. When adults are poisoned, it happens most often through carelessness or lack of information. Taking medicines in the dark or taking medicine prescribed for someone else to treat the same type of problem is a dangerous practice. °SYMPTOMS  °Symptoms of overdose depend on the medication and amount taken. They can vary from over-activity with stimulant over-dosage, to sleepiness from depressants such as  alcohol, narcotics and tranquilizers. Confusion, dizziness, nausea and vomiting may be present. If problems are severe enough coma and death may result. °DIAGNOSIS  °Diagnosis and management are generally straightforward if the drug is known. Otherwise it is more difficult. At times, certain symptoms and signs exhibited by the patient, or blood tests, can reveal the drug in question.  °TREATMENT  °In an emergency department, most patients can be treated with supportive measures. Antidotes may be available if there has been an overdose of opioids or benzodiazepines. A rapid improvement will often occur if this is the cause of overdose. °At home or away from medical care: °· There may be no immediate problems or warning signs in children. °· Not everything works well in all cases of poisoning. °· Take immediate action. Poisons may act quickly. °· If you think someone has swallowed medicine or a household product, and the person is unconscious, having seizures (convulsions), or is not breathing, immediately call for an ambulance. °IF a person is conscious and appears to be doing OK but has swallowed a poison: °· Do not wait to see what effect the poison will have. Immediately call a poison control center (listed in the white pages of your telephone book under "Poison Control" or inside the front cover with other emergency numbers). Some poison control centers have TTY capability for the deaf. Check with your local center if you or someone in your family requires this service. °· Keep the container so you can read the label on the product for ingredients. °· Describe what, when, and how much was taken and the age and condition of the person poisoned.   Inform them if the person is vomiting, choking, drowsy, shows a change in color or temperature of skin, is conscious or unconscious, or is convulsing. °· Do not cause vomiting unless instructed by medical personnel. Do not induce vomiting or force liquids into a person who  is convulsing, unconscious, or very drowsy. °Stay calm and in control.  °· Activated charcoal also is sometimes used in certain types of poisoning and you may wish to add a supply to your emergency medicines. It is available without a prescription. Call a poison control center before using this medication. °PREVENTION  °Thousands of children die every year from unintentional poisoning. This may be from household chemicals, poisoning from carbon monoxide in a car, taking their parent's medications, or simply taking a few iron pills or vitamins with iron. Poisoning comes from unexpected sources. °· Store medicines out of the sight and reach of children, preferably in a locked cabinet. Do not keep medications in a food cabinet. Always store your medicines in a secure place. Get rid of expired medications. °· If you have children living with you or have them as occasional guests, you should have child-resistant caps on your medicine containers. Keep everything out of reach. Child proof your home. °· If you are called to the telephone or to answer the door while you are taking a medicine, take the container with you or put the medicine out of the reach of small children. °· Do not take your medication in front of children. Do not tell your child how good a medication is and how good it is for them. They may get the idea it is more of a treat. °· If you are an adult and have accidentally taken an overdose, you need to consider how this happened and what can be done to prevent it from happening again. If this was from a street drug or alcohol, determine if there is a problem that needs addressing. If you are not sure a problems exists, it is easy to talk to a professional and ask them if they think you have a problem. It is better to handle this problem in this way before it happens again and has a much worse consequence. °Document Released: 02/03/2005 Document Revised: 02/12/2012 Document Reviewed: 07/12/2009 °ExitCare®  Patient Information ©2015 [employer]. This information is not intended to replace advice given to you by your health care provider. Make sure you discuss any questions you have with your health care provider. ° °Emergency Department Resource Guide °1) Find a Doctor and Pay Out of Pocket °Although you won't have to find out who is covered by your insurance plan, it is a good idea to ask around and get recommendations. You will then need to call the office and see if the doctor you have chosen will accept you as a new patient and what types of options they offer for patients who are self-pay. Some doctors offer discounts or will set up payment plans for their patients who do not have insurance, but you will need to ask so you aren't surprised when you get to your appointment. ° °2) Contact Your Local Health Department °Not all health departments have doctors that can see patients for sick visits, but many do, so it is worth a call to see if yours does. If you don't know where your local health department is, you can check in your phone book. The CDC also has a tool to help you locate your state's health department, and many state websites   also have listings of all of their local health departments. ° °3) Find a Walk-in Clinic °If your illness is not likely to be very severe or complicated, you may want to try a walk in clinic. These are popping up all over the country in pharmacies, drugstores, and shopping centers. They're usually staffed by nurse practitioners or physician assistants that have been trained to treat common illnesses and complaints. They're usually fairly quick and inexpensive. However, if you have serious medical issues or chronic medical problems, these are probably not your best option. ° °No Primary Care Doctor: °- Call [facility] at  832-8000 - they can help you locate a primary care doctor that  accepts your insurance, provides certain services, etc. °- Physician Referral Service-  1-800-533-3463 ° °Chronic Pain Problems: °Organization         Address  Phone   Notes  °[facility]  (336) 297-2271 Patients need to be referred by their primary care doctor.  ° °Medication Assistance: °Organization         Address  Phone   Notes  °[place] [facility] [address] °[place], [place] [address] (336) 641-8030 --Must be a resident of [place] °-- Must have NO insurance coverage whatsoever (no Medicaid/ Medicare, etc.) °-- The pt. MUST have a primary care doctor that directs their care regularly and follows them in the community °  °[facility]  (866) 331-1348   °[employer]  (888) 892-1162   ° °Agencies that provide inexpensive medical care: °Organization         Address  Phone   Notes  °[facility]  (336) 832-8035   °[facility]    (336) 832-7272   °[facility] [address] °[place], [place] [address] (336) 832-4777   °[facility] [address], °[place] (336) 271-4999   °[facility]    (336) 373-0678   °[facility]    (336) 272-1050   °[facility] ° [address], [place] Phone:  (336) 832-4444, Fax:  (336) 832-4440 Hours of Operation:  9 am - 6 pm, M-F.  Also accepts Medicaid/Medicare and self-pay.  °[facility] ° [address], [place] Phone: (336) 832-3150, Fax: (336) 832-3151. Hours of Operation:  8:30 am - 5:30 pm, M-F.  Also accepts Medicaid and self-pay.  °[facility] [place] [address], [place] Phone: (336) 878-6027   °[facility] [address], [place], [place] (336)723-1848, Ext. 123 Mondays & Thursdays: 7-9 AM.  First 15 patients are seen on a first come, first serve basis. °  ° °Medicaid-accepting [place] Providers: ° °Organization         Address  Phone   Notes  °[facility] [address],  [place] (336) 641-2100 Also accepts self-pay patients.  °[facility] [address], [place] ° (336) 856-9996   °[facility] [address], [place] (336) 288-8857   °[facility] [address] [place] [address],  (336) 299-7000   °Veita Bland [address],   ° (336) 373-1557 Only accepts [place] Access Medicaid patients after they have their name applied to their card.  ° °Self-Pay (no insurance) in [place]: ° °Organization         Address  Phone     Notes  °Sickle Cell Patients, [facility] [address], [place] (336) 832-1970   °[facility] [address], [place] (336) 832-4400   ° [facility] ° [address], [place] (336) 992-4800   °[facility]/Dr. Osei-Bonsu ° [address] [place] [address], [place] or [address], [place] (336) [phone] Phone number for both [place] and [place] locations is the same.  °[facility] [address], [place] (336) 299-0000   °[facility] [address] [place] [address], [place] or [address] (336) 852-7530 °(336) 878-2260   °[facility] [address], [place] (336) 350-1642, phone; (336) 294-5005, fax Sees patients 1st and 3rd Saturday of every month.  Must not qualify for public or private insurance (i.e. Medicaid, Medicare, [place] Health Choice, Veterans' Benefits) • Household income should be no more than 200% of the poverty level •The clinic cannot treat you if you are pregnant or think you are pregnant • Sexually transmitted diseases are not treated at the clinic.  ° ° °Dental Care: °Organization         Address  Phone  Notes  °[facility] [address], [place] (336) 641-6152 Accepts children up to age 21 who are enrolled in  Medicaid or [place] Health Choice; pregnant women with a Medicaid card; and children who have applied for Medicaid or [place] Health Choice, but were declined, whose parents can pay a reduced fee at time of service.  °[facility] [place]  [address], [place] (336) 641-7733 Accepts children up to age 21 who are enrolled in Medicaid or [place] Health Choice; pregnant women with a Medicaid card; and children who have applied for Medicaid or [place] Health Choice, but were declined, whose parents can pay a reduced fee at time of service.  °[facility] ° [address], [place] (336) 641-4533 Patients are seen by appointment only. Walk-ins are not accepted. [facility] will see patients 18 years of age and older. °Monday - Tuesday (8am-5pm) °Most Wednesdays (8:30-5pm) °$30 per visit, cash only  °[facility] ° [address], [place] (336) 641-4533 Patients are seen by appointment only. Walk-ins are not accepted. [facility] will see patients 18 years of age and older. °One Wednesday Evening (Monthly: Volunteer Based).  $30 per visit, cash only  °[facility]  (919) 537-3737 for adults; Children under age 4, call Graduate Pediatric Dentistry at ([phone]) [phone]. Children aged 4-14, please call (919) [phone] to request a pediatric application. ° Dental services are provided in all areas of dental care including fillings, crowns and bridges, complete and partial dentures, implants, gum treatment, root canals, and extractions. Preventive care is also provided. Treatment is provided to both adults and children. °Patients are selected via a lottery and there is often a waiting list. °  °[facility] [address], °[place] ° (336) 763-8833 [email or web address] °  °[facility] [address], [place], [place] (336)723-1848, Ext. 123 Second and Fourth Thursday of each month, opens at 6:30  AM; Clinic ends at 9 AM.  Patients are seen on a first-come first-served basis, and a limited number are seen during each clinic.  ° °[facility] ° [address], [place], [place] (336) 723-7904     Eligibility Requirements °You must have lived in [place], [place], or [place] counties for at least the last three months. °  You cannot be eligible for state or federal sponsored healthcare insurance, including Veterans Administration, Medicaid, or Medicare. °  You generally cannot be eligible for healthcare insurance through your employer.  °  How to apply: °Eligibility screenings are held every Tuesday and Wednesday afternoon from 1:00 pm until 4:00 pm. You do not need an appointment for the interview!  °[facility] [address], [place], [place] 336-631-2330   °[facility]  336-342-8273   °[facility]  336-703-3100   °[facility]  336-570-6415   ° °Behavioral Health Resources in the Community: °Intensive Outpatient Programs °Organization         Address  Phone  Notes  °[facility] [address], [place], [place] 336-878-6098   °[facility] [address], [place], [place] 336-832-9800   °[facility] [address], [place], [place] ° 336-882-2125   °[facility] [address],  °[place], [place] 1-800-853-5163 or 336-641-4981   °Substance Abuse Resources °Organization         Address  Phone  Notes  °[facility]  336-882-2125   °[facility]  336-784-9470   °[facility]  336-285-9073   °[facility]  336-845-3988   °Residential & Outpatient Substance Abuse Program  1-800-659-3381   °Psychological Services °Organization         Address  Phone  Notes  °[facility]  336- 832-9600   °[facility]  336- 378-7881   °[facility] [address], [place] 1-800-853-5163 or  336-641-4981   ° °Mobile Crisis Teams °Organization         Address  Phone  Notes  °[facility], Mobile Crisis Care Unit  1-877-626-1772   °Assertive °[facility] ° [address] [place], [place] 336-834-9664   °Sharon DeEsch [address] °[place] 336-554-5454   ° °Self-Help/Support Groups °Organization         Address  Phone             Notes  °[facility] [place] - variety of support groups  336- 373-1402 Call for more information  °Narcotics Anonymous (NA), [facility] [address], °[place]  2 meetings at this location  ° °Residential Treatment Programs °Organization         Address  Phone  Notes  °[facility] [address],    °[place]  1-866-801-8205   °[facility] ° [address], [place], [place] 704-293-8524   °[facility] [address], [place] 336-845-3988 Admissions: 8am-3pm M-F  °[facility] [address]    °[place], [place] 336-841-1104   °[facility] [address], [place], [place] 336-379-7146   °[facility] [address]  °[place], [place] 336-285-9073   °[facility] Intensive Outpatient [address], [place], [place] 336-852-3033   °[facility] [address]  °[place], [place] 1-877-615-2722 or 336-784-9470   °[facility] [address] [place], [place] 336-227-7417 Accepts Medicaid  °[facility] [address]  °[place] 1-800-659-3381 Substance Abuse/Addiction Treatment  ° °[place] Behavioral Health Resources °Organization           Address  Phone  Notes  °[facility]  (888) 581-9988   °Julie Brannon, PhD [address] [place], [place]   (336) 349-5553 or (336) 951-0000   °[facility]   [address] °[place], [place] (336) 349-4454   °[facility] [address],  [place], [place] (336) 342-8316 Insurance/Medicaid/sponsorship through [facility]  °[facility] [address]                                    [place], [place] (336) 342-8316 Therapy/tele-psych/case  °[facility] [address]  ° [place], [place] (336) 349-2233    °Dr. Arfeen  (336) 349-4544   °[facility] [place]  [employer] [place] [facility] 1) [address], [place] °2) [address], [place] °3)  [address], [place] (336) 349-3220 °(336) 342-7768 ° °(336) 342-8140   °[place] [facility] (336) 342-1394 or (336) 342-3537 (After Hours)    ° ° °

## 2015-08-25 NOTE — ED Provider Notes (Signed)
CSN: [id number]     Arrival date & time 08/25/15  7829 History   First MD Initiated Contact with Patient 08/25/15 [phone]     Chief Complaint  Patient presents with  . Drug Overdose     (Consider location/radiation/quality/duration/timing/severity/associated sxs/prior Treatment) HPI   Patient is a 44 year old female presenting after overdose. Patient was found in her car with paraphernalia for heroin. She is not breathing that time. She is given 2 doses of Narcan. She woke up. She started vomiting. She is now jittery and anxious. She denies it as a suicide attempt. She says she doesn't usually do heroin and so she accidentally overdosed.  She denies any other symptoms except anxiety. She suffers from bipolar disorder, depression and alcohol abuse.    Past Medical History  Diagnosis Date  . IBS (irritable bowel syndrome)   . Anxiety   . Anorexia   . Bipolar affective disorder   . Back pain   . Depression   . Alcohol abuse   . Polysubstance (excluding opioids) dependence    History reviewed. No pertinent past surgical history. No family history on file. Social History  Substance Use Topics  . Smoking status: Current Every Day Smoker -- 1.50 packs/day for 20 years  . Smokeless tobacco: Never Used  . Alcohol Use: 1.8 oz/week    3 Glasses of wine per week   OB History    No data available     Review of Systems  Constitutional: Negative for activity change and fatigue.  HENT: Negative for congestion and drooling.   Eyes: Negative for discharge.  Respiratory: Negative for cough and chest tightness.   Cardiovascular: Negative for chest pain.  Gastrointestinal: Positive for nausea and vomiting. Negative for abdominal pain and abdominal distention.  Genitourinary: Negative for dysuria and difficulty urinating.  Musculoskeletal: Negative for joint swelling.  Skin: Negative for rash.  Neurological: Negative for seizures.  Psychiatric/Behavioral: Positive for agitation. The  patient is nervous/anxious and is hyperactive.       Allergies  Septra  Home Medications   Prior to Admission medications   Medication Sig Start Date End Date Taking? Authorizing Provider  ALPRAZolam Prudy Feeler) 1 MG tablet Take 1 mg by mouth 3 (three) times daily as needed for anxiety (anxiety).  02/13/15   Historical Provider, MD  amphetamine-dextroamphetamine (ADDERALL) 20 MG tablet Take 20 mg by mouth 3 (three) times daily.    Historical Provider, MD  chlordiazePOXIDE (LIBRIUM) 25 MG capsule Take 1 capsule (25 mg total) by mouth 3 (three) times daily. For anxiety Patient taking differently: Take 25 mg by mouth 2 (two) times daily. For anxiety 02/19/12   Mike Craze, MD  cyclobenzaprine (FLEXERIL) 10 MG tablet Take 10 mg by mouth 2 (two) times daily.     Historical Provider, MD  etodolac (LODINE) 400 MG tablet Take 400 mg by mouth 2 (two) times daily.     Historical Provider, MD  hyoscyamine (LEVSIN SL) 0.125 MG SL tablet Take 0.125 mg by mouth every 6 (six) hours as needed for cramping (cramping).  12/19/14   Historical Provider, MD  lithium 300 MG tablet Take 300-600 mg by mouth 2 (two) times daily. Take 2 tablets (600 mg) in the am and Take 1 tablet (300 mg) at night.    Historical Provider, MD  Oxycodone HCl 10 MG TABS Take 10 mg by mouth 4 (four) times daily.    Historical Provider, MD  Vitamin D, Ergocalciferol, (DRISDOL) 50000 UNITS CAPS capsule Take 50,000 Units by mouth  every 7 (seven) days.  01/20/15   Historical Provider, MD   BP 121/76 mmHg  Pulse 94  Temp(Src) 97.7 F (36.5 C) (Oral)  Resp 13  Ht 5' 4.75" (1.645 m)  Wt 105 lb (47.628 kg)  BMI 17.60 kg/m2  SpO2 97%  LMP 08/11/2015 Physical Exam  Constitutional: She is oriented to person, place, and time. She appears well-developed and well-nourished.  Very thin female  HENT:  Head: Normocephalic and atraumatic.  Eyes: Conjunctivae are normal. Right eye exhibits no discharge.  Neck: Neck supple.  Cardiovascular:  Normal rate, regular rhythm and normal heart sounds.   No murmur heard. Pulmonary/Chest: Effort normal and breath sounds normal. She has no wheezes. She has no rales.  Abdominal: Soft. She exhibits no distension. There is no tenderness.  Musculoskeletal: Normal range of motion. She exhibits no edema.  Neurological: She is oriented to person, place, and time. No cranial nerve deficit.  Skin: Skin is warm and dry. No rash noted. She is not diaphoretic.  No evidence of abscess or cellulitis  Psychiatric:  anxious  Nursing note and vitals reviewed.   ED Course  Procedures (including critical care time) Labs Review Labs Reviewed - No data to display  Imaging Review No results found. I have personally reviewed and evaluated these images and lab results as part of my medical decision-making.   EKG Interpretation None      MDM   Final diagnoses:  None    Patient is a 44 year old female presenting after drug overdose. Patient had heroin today which she doesn't usually take. She overdosed and required 2 doses of Narcan intranasally. She is now doing much better. We'll watch her for 4 hours to make sure the Narcan doesn't wear off and that she doesn't become increasing somnolent.  She denies SI.    Courteney Randall An, MD 08/25/15 [phone]

## 2015-08-25 NOTE — ED Notes (Signed)
Pt refused lab work.  RN aware.  

## 2015-08-25 NOTE — ED Notes (Signed)
Bed: WA17 Expected date:  Expected time:  Means of arrival:  Comments: RES A 

## 2015-11-19 ENCOUNTER — Emergency Department (HOSPITAL_COMMUNITY): Payer: Medicaid Other

## 2015-11-19 ENCOUNTER — Emergency Department (HOSPITAL_COMMUNITY)
Admission: EM | Admit: 2015-11-19 | Discharge: 2015-11-19 | Disposition: A | Discharge status: Home / Self Care | Payer: Medicaid Other | Attending: Emergency Medicine | Admitting: Emergency Medicine

## 2015-11-19 ENCOUNTER — Encounter (HOSPITAL_COMMUNITY): Payer: Self-pay | Admitting: Emergency Medicine

## 2015-11-19 DIAGNOSIS — S1011XA Abrasion of throat, initial encounter: Secondary | ICD-10-CM | POA: Diagnosis not present

## 2015-11-19 DIAGNOSIS — R1013 Epigastric pain: Secondary | ICD-10-CM

## 2015-11-19 DIAGNOSIS — Z8719 Personal history of other diseases of the digestive system: Secondary | ICD-10-CM | POA: Insufficient documentation

## 2015-11-19 DIAGNOSIS — F319 Bipolar disorder, unspecified: Secondary | ICD-10-CM | POA: Diagnosis not present

## 2015-11-19 DIAGNOSIS — Z791 Long term (current) use of non-steroidal anti-inflammatories (NSAID): Secondary | ICD-10-CM | POA: Insufficient documentation

## 2015-11-19 DIAGNOSIS — F419 Anxiety disorder, unspecified: Secondary | ICD-10-CM | POA: Diagnosis not present

## 2015-11-19 DIAGNOSIS — S0031XA Abrasion of nose, initial encounter: Secondary | ICD-10-CM | POA: Diagnosis not present

## 2015-11-19 DIAGNOSIS — S3991XA Unspecified injury of abdomen, initial encounter: Secondary | ICD-10-CM | POA: Insufficient documentation

## 2015-11-19 DIAGNOSIS — Y998 Other external cause status: Secondary | ICD-10-CM | POA: Insufficient documentation

## 2015-11-19 DIAGNOSIS — Z79899 Other long term (current) drug therapy: Secondary | ICD-10-CM | POA: Insufficient documentation

## 2015-11-19 DIAGNOSIS — Y9289 Other specified places as the place of occurrence of the external cause: Secondary | ICD-10-CM | POA: Diagnosis not present

## 2015-11-19 DIAGNOSIS — F17200 Nicotine dependence, unspecified, uncomplicated: Secondary | ICD-10-CM | POA: Insufficient documentation

## 2015-11-19 DIAGNOSIS — Y9389 Activity, other specified: Secondary | ICD-10-CM | POA: Insufficient documentation

## 2015-11-19 LAB — CBC
HEMATOCRIT: 35.9 % — AB (ref 36.0–46.0)
HEMOGLOBIN: 11.5 g/dL — AB (ref 12.0–15.0)
MCH: 29.3 pg (ref 26.0–34.0)
MCHC: 32 g/dL (ref 30.0–36.0)
MCV: 91.3 fL (ref 78.0–100.0)
Platelets: 306 10*3/uL (ref 150–400)
RBC: 3.93 MIL/uL (ref 3.87–5.11)
RDW: 14 % (ref 11.5–15.5)
WBC: 27.4 10*3/uL — ABNORMAL HIGH (ref 4.0–10.5)

## 2015-11-19 LAB — URINALYSIS, ROUTINE W REFLEX MICROSCOPIC
GLUCOSE, UA: NEGATIVE mg/dL
HGB URINE DIPSTICK: NEGATIVE
Ketones, ur: NEGATIVE mg/dL
Leukocytes, UA: NEGATIVE
Nitrite: NEGATIVE
PH: 8 (ref 5.0–8.0)
Protein, ur: NEGATIVE mg/dL
SPECIFIC GRAVITY, URINE: 1.017 (ref 1.005–1.030)

## 2015-11-19 LAB — COMPREHENSIVE METABOLIC PANEL
ALBUMIN: 4.3 g/dL (ref 3.5–5.0)
ALK PHOS: 48 U/L (ref 38–126)
ALT: 13 U/L — ABNORMAL LOW (ref 14–54)
ANION GAP: 11 (ref 5–15)
AST: 19 U/L (ref 15–41)
BUN: 8 mg/dL (ref 6–20)
CALCIUM: 9.5 mg/dL (ref 8.9–10.3)
CHLORIDE: 107 mmol/L (ref 101–111)
CO2: 24 mmol/L (ref 22–32)
Creatinine, Ser: 0.57 mg/dL (ref 0.44–1.00)
GFR calc non Af Amer: >60 mL/min (ref >60)
GLUCOSE: 97 mg/dL (ref 65–99)
Potassium: 3.9 mmol/L (ref 3.5–5.1)
SODIUM: 142 mmol/L (ref 135–145)
Total Bilirubin: 0.5 mg/dL (ref 0.3–1.2)
Total Protein: 7 g/dL (ref 6.5–8.1)

## 2015-11-19 LAB — LIPASE, BLOOD: LIPASE: 30 U/L (ref 11–51)

## 2015-11-19 MED ORDER — IOHEXOL 300 MG/ML  SOLN
80.0000 mL | Freq: Once | INTRAMUSCULAR | Status: AC | PRN
  Administered 2015-11-19: 80 mL via INTRAVENOUS

## 2015-11-19 MED ORDER — SODIUM CHLORIDE 0.9 % IV BOLUS (SEPSIS)
1000.0000 mL | Freq: Once | INTRAVENOUS | Status: AC
  Administered 2015-11-19: 1000 mL via INTRAVENOUS

## 2015-11-19 MED ORDER — MORPHINE SULFATE (PF) 4 MG/ML IV SOLN
4.0000 mg | Freq: Once | INTRAVENOUS | Status: AC
  Administered 2015-11-19: 4 mg via INTRAVENOUS
  Filled 2015-11-19: qty 1

## 2015-11-19 MED ORDER — IOHEXOL 300 MG/ML  SOLN
50.0000 mL | Freq: Once | INTRAMUSCULAR | Status: AC | PRN
  Administered 2015-11-19: 50 mL via ORAL

## 2015-11-19 MED ORDER — ONDANSETRON HCL 4 MG/2ML IJ SOLN
4.0000 mg | Freq: Once | INTRAMUSCULAR | Status: AC
  Administered 2015-11-19: 4 mg via INTRAVENOUS
  Filled 2015-11-19: qty 2
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
## 2015-11-19 NOTE — Discharge Instructions (Signed)
Please follow with your primary care doctor in the next 2 days for a check-up. They must obtain records for further management.  ° °Do not hesitate to return to the Emergency Department for any new, worsening or concerning symptoms.  ° ° °Abdominal Pain, Adult °Many things can cause abdominal pain. Usually, abdominal pain is not caused by a disease and will improve without treatment. It can often be observed and treated at home. Your health care provider will do a physical exam and possibly order blood tests and X-rays to help determine the seriousness of your pain. However, in many cases, more time must pass before a clear cause of the pain can be found. Before that point, your health care provider may not know if you need more testing or further treatment. °HOME CARE INSTRUCTIONS °Monitor your abdominal pain for any changes. The following actions may help to alleviate any discomfort you are experiencing: °· Only take over-the-counter or prescription medicines as directed by your health care provider. °· Do not take laxatives unless directed to do so by your health care provider. °· Try a clear liquid diet (broth, tea, or water) as directed by your health care provider. Slowly move to a bland diet as tolerated. °SEEK MEDICAL CARE IF: °· You have unexplained abdominal pain. °· You have abdominal pain associated with nausea or diarrhea. °· You have pain when you urinate or have a bowel movement. °· You experience abdominal pain that wakes you in the night. °· You have abdominal pain that is worsened or improved by eating food. °· You have abdominal pain that is worsened with eating fatty foods. °· You have a fever. °SEEK IMMEDIATE MEDICAL CARE IF: °· Your pain does not go away within 2 hours. °· You keep throwing up (vomiting). °· Your pain is felt only in portions of the abdomen, such as the right side or the left lower portion of the abdomen. °· You pass bloody or black tarry stools. °MAKE SURE YOU: °· Understand  these instructions. °· Will watch your condition. °· Will get help right away if you are not doing well or get worse. °  °This information is not intended to replace advice given to you by your health care provider. Make sure you discuss any questions you have with your health care provider. °  °Document Released: 08/30/2005 Document Revised: 08/11/2015 Document Reviewed: 07/30/2013 °Elsevier Interactive Patient Education ©2016 [employer] ° °

## 2015-11-19 NOTE — ED Provider Notes (Signed)
CSN: [id number]     Arrival date & time 11/19/15  1150 History   First MD Initiated Contact with Patient 11/19/15 1256     Chief Complaint  Patient presents with  . Abdominal Pain  . Anxiety     (Consider location/radiation/quality/duration/timing/severity/associated sxs/prior Treatment) HPI   Blood pressure 106/86, pulse 91, temperature 97.9 F (36.6 C), temperature source Oral, resp. rate 18, last menstrual period 10/12/2015, SpO2 100 %.  Barbara Shaw is a 44 y.o. female with past medical history significant for bipolar, IBS, alcohol abuse, polysubstance abuse and history of heroin overdose complaining of anxiety, agitation and abdominal pain onset last night with no associated emesis (states that she was trying to make herself throw up because she thought that would ease the pain), diarrhea at her baseline secondary to IBS. Patient denies melena, hematochezia. 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 States that her abdominal pain is severe and she was encouraged to come to the ED by her sister-in-law who called EMS. No prior abdominal surgeries.   Past Medical History  Diagnosis Date  . IBS (irritable bowel syndrome)   . Anxiety   . Anorexia   . Bipolar affective disorder (HCC)   . Back pain   . Depression   . Alcohol abuse   . Polysubstance (excluding opioids) dependence (HCC)    History reviewed. No pertinent past surgical history. History reviewed. No pertinent family history. Social History  Substance Use Topics  . Smoking status: Current Every Day Smoker -- 1.50 packs/day for 20 years  . Smokeless  tobacco: Never Used  . Alcohol Use: 1.8 oz/week    3 Glasses of wine per week   OB History    No data available     Review of Systems  10 systems reviewed and found to be negative, except as noted in the HPI.   Allergies  Septra  Home Medications   Prior to Admission medications   Medication Sig Start Date End Date Taking? Authorizing Provider  ALPRAZolam Prudy Feeler(XANAX) 1 MG tablet Take 1 mg by mouth daily as needed for anxiety (anxiety).  02/13/15  Yes Historical Provider, MD  amphetamine-dextroamphetamine (ADDERALL) 20 MG tablet Take 20 mg by mouth 3 (three) times daily.   Yes Historical Provider, MD  chlordiazePOXIDE (LIBRIUM) 25 MG capsule Take 1 capsule (25 mg total) by mouth 3 (three) times daily. For anxiety Patient taking differently: Take 25 mg by mouth 2 (two) times daily. For anxiety 02/19/12  Yes Mike CrazeEdwin O Walker, MD  cyclobenzaprine (FLEXERIL) 10 MG tablet Take 10 mg by mouth 2 (two) times daily.    Yes Historical Provider, MD  etodolac (LODINE) 400 MG tablet Take 400 mg by mouth 2 (two) times daily.    Yes Historical Provider, MD  lithium 300 MG tablet Take 300-600 mg by mouth 2 (two) times daily. Take 2 tablets (600 mg) in the am and Take 1 tablet (300 mg) at night.   Yes Historical Provider, MD   BP 106/86 mmHg  Pulse 91  Temp(Src) 97.9 F (36.6 C) (Oral)  Resp 18  SpO2 100%  LMP 10/12/2015 (Approximate) Physical Exam  Constitutional: She is oriented to person, place, and time. She  appears well-developed and well-nourished. No distress.  HENT:  Head: Normocephalic.  Abrasion to nose and throat, no signs of secondary infection  Eyes: Conjunctivae and EOM are normal. Pupils are equal, round, and reactive to light.  Neck: Normal range of motion.  Cardiovascular: Normal rate and regular rhythm.   Pulmonary/Chest: Effort normal and breath sounds normal. No stridor. No respiratory distress. She has no wheezes. She has no rales. She exhibits no tenderness.  Abdominal: Soft.  Bowel sounds are normal. She exhibits no distension and no mass. There is tenderness. There is no rebound and no guarding.  Epigastric abdominal TTP >> right lower quadrant pain, no guarding or rebound. Rovsing negative, so as positive, obturator are negative  Musculoskeletal: Normal range of motion.  Neurological: She is alert and oriented to person, place, and time.  Psychiatric: She has a normal mood and affect.  Nursing note and vitals reviewed.   ED Course  Procedures (including critical care time) Labs Review Labs Reviewed  COMPREHENSIVE METABOLIC PANEL - Abnormal; Notable for the following:    ALT 13 (*)    All other components within normal limits  CBC - Abnormal; Notable for the following:    WBC 27.4 (*)    Hemoglobin 11.5 (*)    HCT 35.9 (*)    All other components within normal limits  URINALYSIS, ROUTINE W REFLEX MICROSCOPIC (NOT AT [facility]) - Abnormal; Notable for the following:    APPearance CLOUDY (*)    Bilirubin Urine MODERATE (*)    All other components within normal limits  LIPASE, BLOOD    Imaging Review No results found. I have personally reviewed and evaluated these images and lab results as part of my medical decision-making.   EKG Interpretation None      MDM   Final diagnoses:  Epigastric pain    Filed Vitals:   11/19/15 1202 11/19/15 1410 11/19/15 1555  BP: 121/93 120/92 106/86  Pulse: 87 89 91  Temp: 97.3 F (36.3 C) 97.8 F (36.6 C) 97.9 F (36.6 C)  TempSrc: Oral Oral Oral  Resp: SpO2: 100% 100% 100%    Medications  sodium chloride 0.9 % bolus 1,000 mL (1,000 mLs Intravenous New Bag/Given 11/19/15 1414)  morphine 4 MG/ML injection 4 mg (4 mg Intravenous Given 11/19/15 1415)  ondansetron (ZOFRAN) injection 4 mg (4 mg Intravenous Given 11/19/15 1414)  iohexol (OMNIPAQUE) 300 MG/ML solution 50 mL (50 mLs Oral Contrast Given 11/19/15 1415)  iohexol (OMNIPAQUE) 300 MG/ML solution 80 mL (80 mLs Intravenous Contrast Given  11/19/15 1538)    Barbara Shaw is 44 y.o. female presenting with anxiety and abdominal pain. Abdominal exam is nonsurgical, she has both epigastric and right lower quadrant abdominal pain. No nausea vomiting diarrhea or fever, however she does have a white count of 27.4. Considering her white count and right lower quadrant pain, we'll scan the abdomen.  CT negative, don't think this patient needs emergent psychiatric intervention.   Evaluation does not show pathology that would require ongoing emergent intervention or inpatient treatment. Pt is hemodynamically stable and mentating appropriately. Discussed findings and plan with patient/guardian, who agrees with care plan. All questions answered. Return precautions discussed and outpatient follow up given.   New Prescriptions   No medications on file        Wynetta Emery, PA-C 11/19/15 1609  Lorre Nick, MD 11/22/15 2257

## 2016-03-15 ENCOUNTER — Ambulatory Visit: Payer: Medicaid Other | Admitting: Diagnostic Neuroimaging

## 2016-03-16 ENCOUNTER — Encounter: Payer: Self-pay | Admitting: Diagnostic Neuroimaging

## 2017-05-22 IMAGING — CT CT ABD-PELV W/ CM
2 of 5 series · 16 of 46 positions shown, 18 images · IV contrast (OMNIPAQUE 300)
Comparison: 11/08/2011

CLINICAL DATA: Right-sided abdominal pain.

EXAM:
CT ABDOMEN AND PELVIS WITH CONTRAST
TECHNIQUE: Multidetector CT imaging of the abdomen and pelvis was performed
using the standard protocol following bolus administration of
intravenous contrast.
CONTRAST:  50mL OMNIPAQUE IOHEXOL 300 MG/ML SOLN, 80mL OMNIPAQUE
IOHEXOL 300 MG/ML SOLN

[Series 2: abd/pel with · axial · 0.65mm/px · z∈[-884,-529]mm · 13 of 81 slices shown, 15 images]
[im 5/81  soft-tissue]
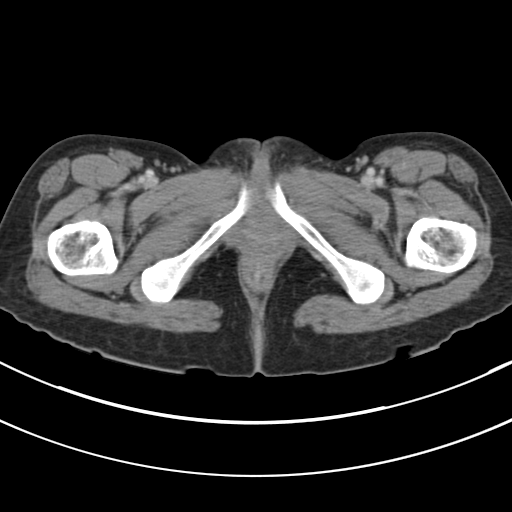
[im 5/81  bone]
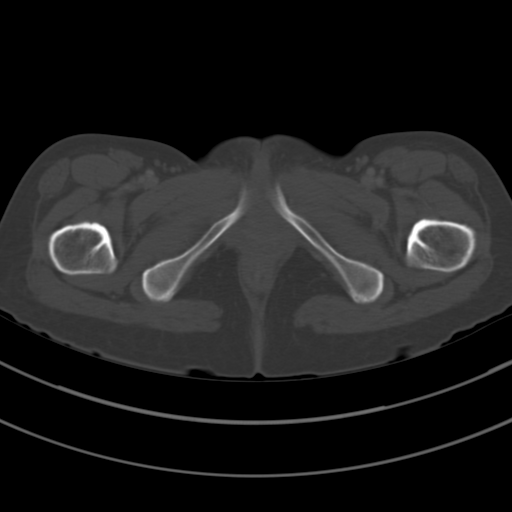
[im 9/81  soft-tissue]
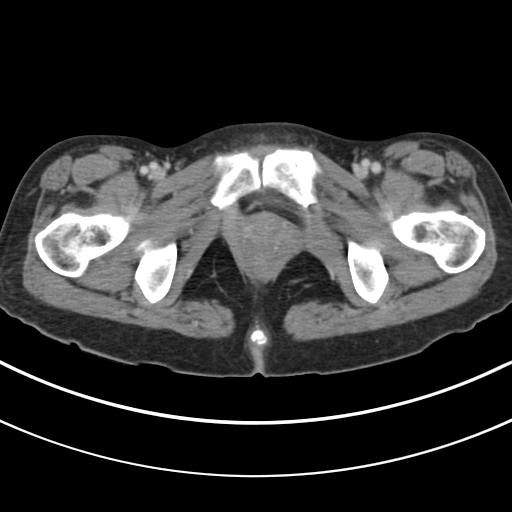
[im 18/81  soft-tissue]
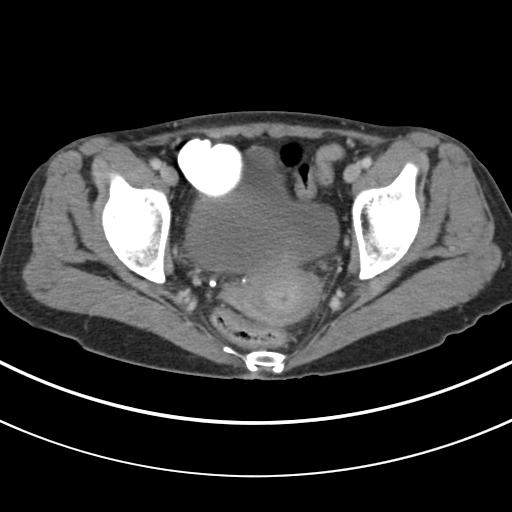
[im 23/81  soft-tissue]
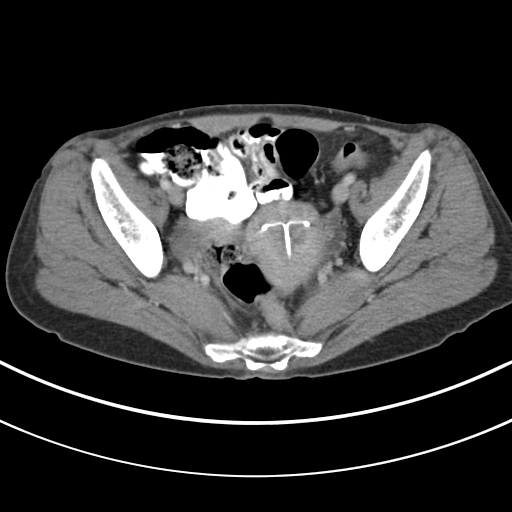
[im 27/81  soft-tissue]
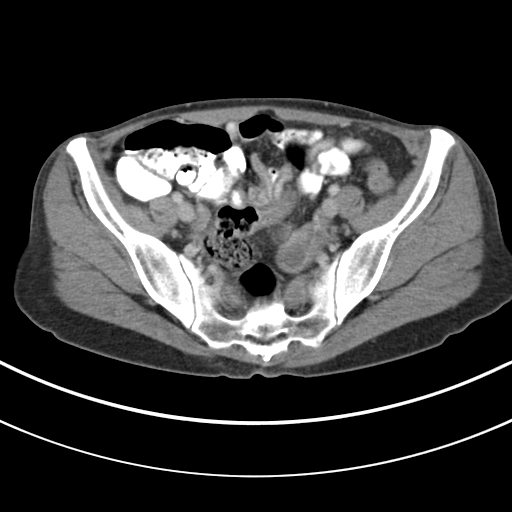
[im 36/81  soft-tissue]
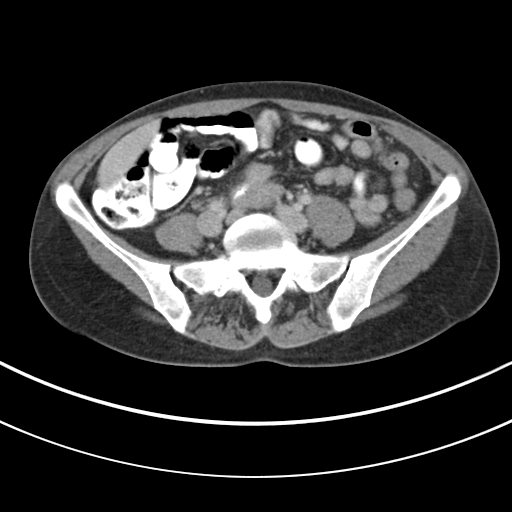
[im 41/81  soft-tissue]
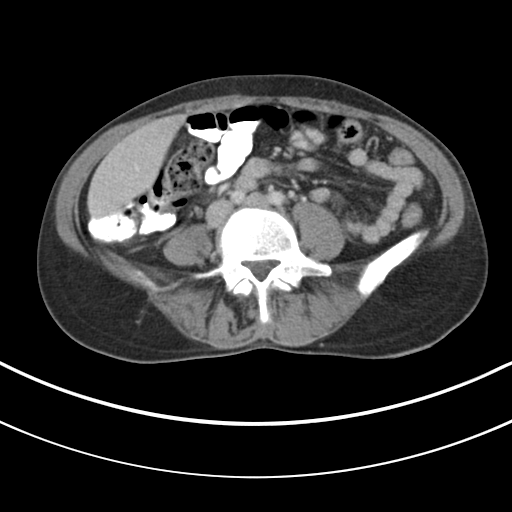
[im 45/81  soft-tissue]
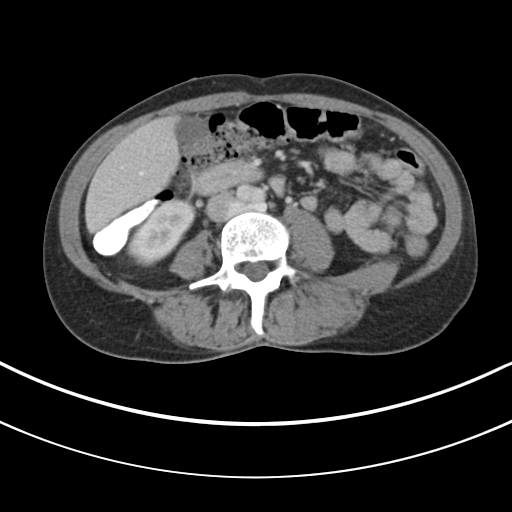
[im 54/81  soft-tissue]
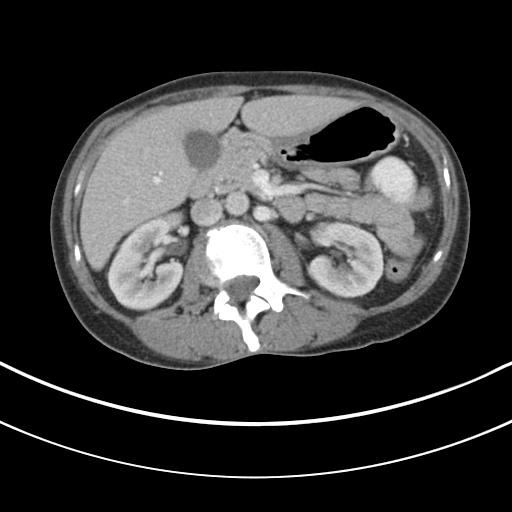
[im 54/81  bone]
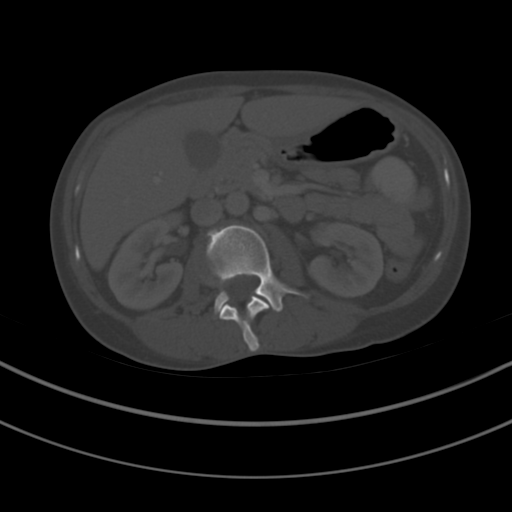
[im 58/81  soft-tissue]
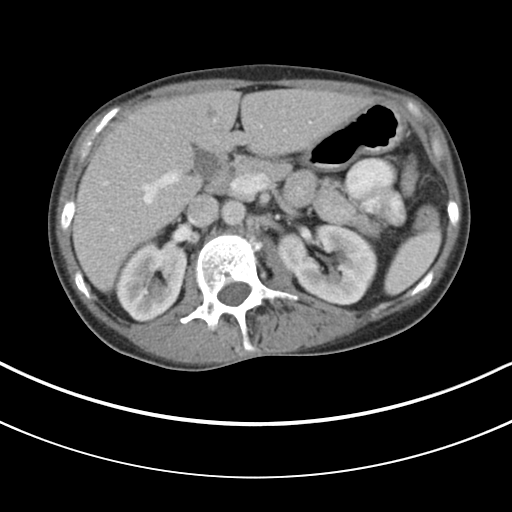
[im 63/81  soft-tissue]
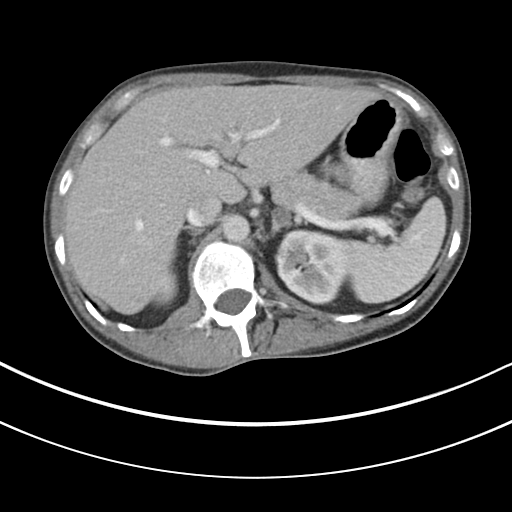
[im 72/81  soft-tissue]
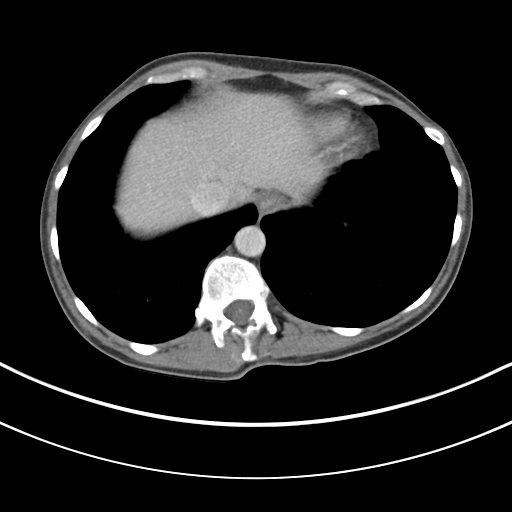
[im 76/81  soft-tissue]
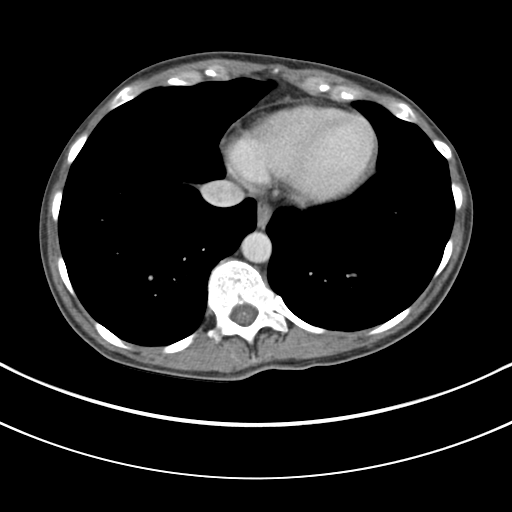

[Series 4: coronal a/|p · coronal · 0.69mm/px · 3 of 71 slices shown]
[im 24/71  soft-tissue]
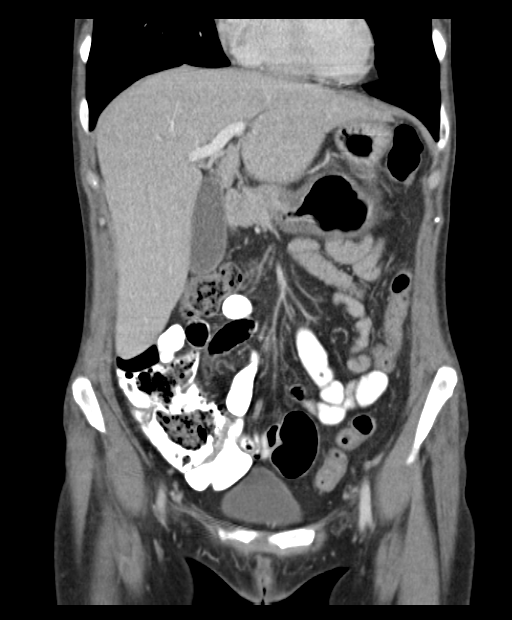
[im 32/71  soft-tissue]
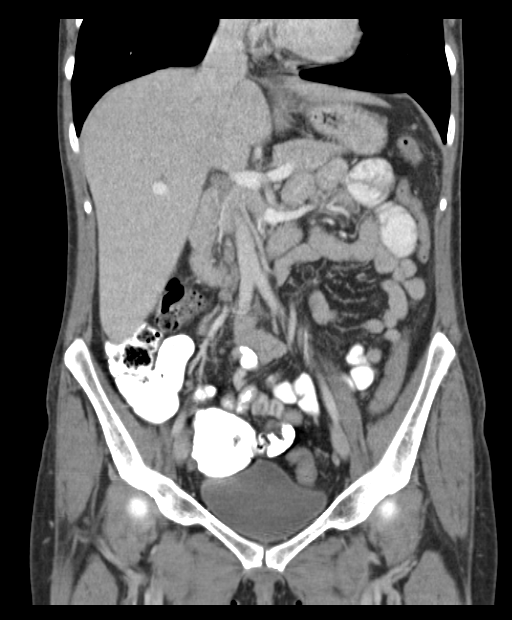
[im 39/71  soft-tissue]
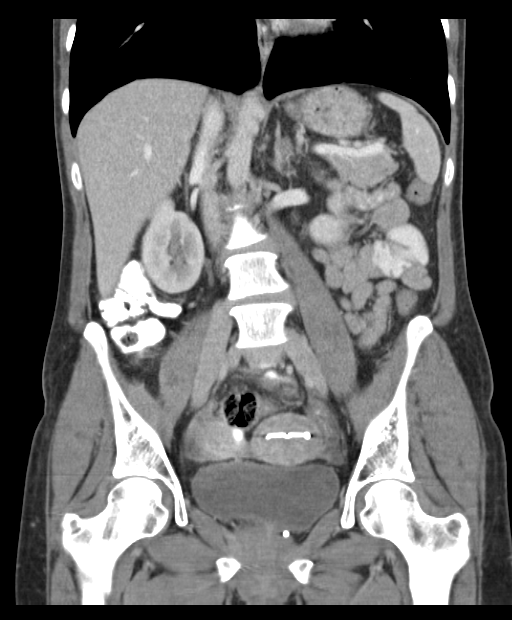

[16 of 46 positions shown; findings below may reference images not displayed]

FINDINGS: Lower chest: Clear lung bases. Normal heart size without pericardial
or pleural effusion.

Hepatobiliary: Scattered hepatic cysts. Normal gallbladder, without
biliary ductal dilatation.

Pancreas: Normal, without mass or ductal dilatation.

Spleen: Normal in size, without focal abnormality.

Adrenals/Urinary Tract: Normal right adrenal gland. Left adrenal
nodularity is unchanged and low density at 13 mm, favoring an
adenoma. Upper pole left renal scarring. Normal right kidney,
without hydronephrosis. Normal urinary bladder.

Stomach/Bowel: Normal stomach, without wall thickening. And Normal
colon and terminal ileum. Appendix is not visualized but there is no
evidence of right lower quadrant inflammation. Normal small bowel.

Vascular/Lymphatic: Normal caliber of the aorta and branch vessels.
Retroaortic left renal vein. No abdominopelvic adenopathy.

Reproductive: Intrauterine device.  No adnexal mass.

Other: Trace free pelvic fluid is likely physiologic.

Musculoskeletal: Convex right lumbar spine curvature. Right iliac
bone island.
IMPRESSION: No acute process in the abdomen or pelvis.
